# Patient Record
Sex: Female | Born: 1960 | Marital: Married | Smoking: Current every day smoker
Health system: Southern US, Community
[De-identification: ages and names within clinical notes are randomized; demographics above are authoritative.]

## PROBLEM LIST (undated history)

## (undated) DIAGNOSIS — I739 Peripheral vascular disease, unspecified: Secondary | ICD-10-CM

## (undated) DIAGNOSIS — M1990 Unspecified osteoarthritis, unspecified site: Secondary | ICD-10-CM

## (undated) HISTORY — PX: FOOT SURGERY: SHX648

## (undated) HISTORY — DX: Peripheral vascular disease, unspecified: I73.9

## (undated) HISTORY — DX: Unspecified osteoarthritis, unspecified site: M19.90

---

## 2011-09-20 ENCOUNTER — Other Ambulatory Visit: Payer: Self-pay | Admitting: Obstetrics & Gynecology

## 2011-09-20 DIAGNOSIS — Z1231 Encounter for screening mammogram for malignant neoplasm of breast: Secondary | ICD-10-CM

## 2011-10-18 ENCOUNTER — Ambulatory Visit (HOSPITAL_COMMUNITY): Admission: RE | Admit: 2011-10-18 | Payer: BC Managed Care – PPO | Source: Ambulatory Visit

## 2012-02-02 ENCOUNTER — Ambulatory Visit: Payer: Managed Care, Other (non HMO)

## 2012-02-02 ENCOUNTER — Ambulatory Visit (INDEPENDENT_AMBULATORY_CARE_PROVIDER_SITE_OTHER): Payer: Managed Care, Other (non HMO) | Admitting: Family Medicine

## 2012-02-02 DIAGNOSIS — R079 Chest pain, unspecified: Secondary | ICD-10-CM

## 2012-02-02 NOTE — Progress Notes (Signed)
  Subjective:    Patient ID: Holly Barrera, female    DOB: 27-Jan-1961, 51 y.o.   MRN: [id number]  HPI Developed left sided rib pain 5 days ago.  It lasted 2 days.   It started the morning following Zumba dancing as she awoke with the pain.  It has reoccurred over the last 2 days.  It has progressed to involve the upper left chest.  No SOB.  The pain is exacerbated with deep breathing.  She is coughing some.  Today at work the pain got worse. No rash or fever.  Has not really had pain like this before.  No GI or GU sxs.    She has some tight and burning left sided chest discomfort today.  No indigestion.  Review of Systems    no weight loss. No paresthesias. Objective:   Physical Exam GEN: [id number].NAD. HEENT: Normal.  Oropharynx clear.  No neck adenopathy. CHEST: CTAB. Left lower rib teenderness CVS:RRR w/o m/r/g Negative homan's and no leg swelling. NEURO: normal strength.  Sensation is intact. SKIN:  No rash. ABDM:  Mild tenderness in the epigastric region.  No palpable masses.  Chest xray: 2 view reveals chronic markings no infiltrates or acute changes noted  EKG:  NSR with T wave inversion leads V3-V6    Assessment & Plan:  Left lower rib pain  She will increase ibuprofen 200 mg to 3 po tid as needed   She has oxycodone to use    Left upper chest pain/shoulder pain  atypical and associated with deep breathing  Given ECG changes she will be set up for a ETT  ASA 81 mg qd.  patient hand out given.

## 2012-02-02 NOTE — Patient Instructions (Signed)
Chest Pain (Nonspecific) It is often hard to give a specific diagnosis for the cause of chest pain. There is always a chance that your pain could be related to something serious, such as a heart attack or a blood clot in the lungs. You need to follow up with your caregiver for further evaluation. CAUSES   Heartburn.   Pneumonia or bronchitis.   Anxiety or stress.   Inflammation around your heart (pericarditis) or lung (pleuritis or pleurisy).   A blood clot in the lung.   A collapsed lung (pneumothorax). It can develop suddenly on its own (spontaneous pneumothorax) or from injury (trauma) to the chest.   Shingles infection (herpes zoster virus).  The chest wall is composed of bones, muscles, and cartilage. Any of these can be the source of the pain.  The bones can be bruised by injury.   The muscles or cartilage can be strained by coughing or overwork.   The cartilage can be affected by inflammation and become sore (costochondritis).  DIAGNOSIS  Lab tests or other studies, such as X-rays, electrocardiography, stress testing, or cardiac imaging, may be needed to find the cause of your pain.  TREATMENT   Treatment depends on what may be causing your chest pain. Treatment may include:   Acid blockers for heartburn.   Anti-inflammatory medicine.   Pain medicine for inflammatory conditions.   Antibiotics if an infection is present.   You may be advised to change lifestyle habits. This includes stopping smoking and avoiding alcohol, caffeine, and chocolate.   You may be advised to keep your head raised (elevated) when sleeping. This reduces the chance of acid going backward from your stomach into your esophagus.   Most of the time, nonspecific chest pain will improve within 2 to 3 days with rest and mild pain medicine.  HOME CARE INSTRUCTIONS   If antibiotics were prescribed, take your antibiotics as directed. Finish them even if you start to feel better.   For the next few  days, avoid physical activities that bring on chest pain. Continue physical activities as directed.   Do not smoke.   Avoid drinking alcohol.   Only take over-the-counter or prescription medicine for pain, discomfort, or fever as directed by your caregiver.   Follow your caregiver's suggestions for further testing if your chest pain does not go away.   Keep any follow-up appointments you made. If you do not go to an appointment, you could develop lasting (chronic) problems with pain. If there is any problem keeping an appointment, you must call to reschedule.  SEEK MEDICAL CARE IF:   You think you are having problems from the medicine you are taking. Read your medicine instructions carefully.   Your chest pain does not go away, even after treatment.   You develop a rash with blisters on your chest.  SEEK IMMEDIATE MEDICAL CARE IF:   You have increased chest pain or pain that spreads to your arm, neck, jaw, back, or abdomen.   You develop shortness of breath, an increasing cough, or you are coughing up blood.   You have severe back or abdominal pain, feel nauseous, or vomit.   You develop severe weakness, fainting, or chills.   You have a fever.  THIS IS AN EMERGENCY. Do not wait to see if the pain will go away. Get medical help at once. Call your local emergency services (911 in [place] Do not drive yourself to the hospital. MAKE SURE YOU:   Understand these instructions.     Will watch your condition.   Will get help right away if you are not doing well or get worse.  Document Released: 08/10/2005 Document Revised: 10/20/2011 Document Reviewed: 06/05/2008 [employer] Patient Information 2012 [employer]  YOU WILL BE SET UP FOR A CARDIAC STRESS TEST DUE TO THE SUBTLE CHANGES ON YOU ELECTROCARDIOGRAM. TAKE ASPIRIN 81 MG DAILY RECOMMENDED TO STOP SMOKING. USE THE IBUPROFEN AND OXYCODONE AS DISCUSSED.

## 2012-02-10 ENCOUNTER — Encounter: Payer: Self-pay | Admitting: Internal Medicine

## 2012-11-14 HISTORY — PX: ILIAC ARTERY STENT: SHX1786

## 2013-04-01 ENCOUNTER — Ambulatory Visit: Payer: No Typology Code available for payment source

## 2013-04-01 ENCOUNTER — Ambulatory Visit (INDEPENDENT_AMBULATORY_CARE_PROVIDER_SITE_OTHER): Payer: No Typology Code available for payment source | Admitting: Family Medicine

## 2013-04-01 VITALS — BP 124/70 | HR 82 | Temp 98.7°F | Resp 18 | Ht 67.5 in | Wt 158.0 lb

## 2013-04-01 DIAGNOSIS — R200 Anesthesia of skin: Secondary | ICD-10-CM

## 2013-04-01 DIAGNOSIS — M25559 Pain in unspecified hip: Secondary | ICD-10-CM

## 2013-04-01 DIAGNOSIS — M25551 Pain in right hip: Secondary | ICD-10-CM

## 2013-04-01 DIAGNOSIS — M25552 Pain in left hip: Secondary | ICD-10-CM

## 2013-04-01 DIAGNOSIS — R209 Unspecified disturbances of skin sensation: Secondary | ICD-10-CM

## 2013-04-01 DIAGNOSIS — M25569 Pain in unspecified knee: Secondary | ICD-10-CM

## 2013-04-01 LAB — POCT CBC
Granulocyte percent: 59.2 % (ref 37–80)
HCT, POC: 43 % (ref 37.7–47.9)
Hemoglobin: 13.4 g/dL (ref 12.2–16.2)
Lymph, poc: 4 — AB (ref 0.6–3.4)
MCH, POC: 30.1 pg (ref 27–31.2)
MCHC: 31.2 g/dL — AB (ref 31.8–35.4)
MCV: 96.6 fL (ref 80–97)
MID (cbc): 0.8 (ref 0–0.9)
MPV: 9.3 fL (ref 0–99.8)
POC Granulocyte: 7 — AB (ref 2–6.9)
POC LYMPH PERCENT: 33.6 %L (ref 10–50)
POC MID %: 7.2 %M (ref 0–12)
Platelet Count, POC: 386 10*3/uL (ref 142–424)
RBC: 4.45 M/uL (ref 4.04–5.48)
RDW, POC: 14.2 %
WBC: 11.8 10*3/uL — AB (ref 4.6–10.2)

## 2013-04-01 LAB — POCT SEDIMENTATION RATE: POCT SED RATE: 20 mm/h (ref 0–22)

## 2013-04-01 LAB — POCT GLYCOSYLATED HEMOGLOBIN (HGB A1C): Hemoglobin A1C: 5.2

## 2013-04-01 MED ORDER — MELOXICAM 7.5 MG PO TABS: 7.5000 mg | ORAL_TABLET | Freq: Every day | ORAL | Status: DC

## 2013-04-01 NOTE — Progress Notes (Addendum)
[facility]:  Office Visit  Chief Complaint:  Chief Complaint  Patient presents with  . Weakness    bilateral femoral region  . felt knot on Left leg recently-but is no longer there    HPI: Holly Barrera is a 52 y.o. female who complains of bilateral hip pain, worse on right side x 1 month. She feels like it catches and wants to pop but doesn't . She hurts walking from parking lot to office room. She has had sxs x 1 month. She has numbness periodically. Has tried ibuprofen and exercise. Exercise makes it worse. She has no hip injuries. NKI to joints or legs in past.  She was told that she had osteoporosis when she had foot surgery but has never had dexa scan.Numbing aching pain, only when she walks.Also sometimes when she gets up from couch She has had charlie horse, cramps in foot and legs. All  cramps, pressure goes away when she stops walking. Patient is a smoker 1 ppd x 25-30 years. Denies weakness,HTN, diabetes, PVD/PAD, hyperlipidemia, varicose veins    History reviewed. No pertinent past medical history. Past Surgical History  Procedure Laterality Date  . Foot surgery      Dr Brynda Greathouse [facility] foot Center) --left foot bunionectomy (10/2011), right foot callus/bunion resection 11/2011   History   Social History  . Marital Status: Married    Spouse Name: N/A    Number of Children: N/A  . Years of Education: N/A   Social History Main Topics  . Smoking status: Current Every Day Smoker -- 25 years  . Smokeless tobacco: None  . Alcohol Use: No  . Drug Use: No  . Sexually Active: Yes   Other Topics Concern  . None   Social History Narrative  . None   History reviewed. No pertinent family history. Allergies  Allergen Reactions  . Penicillins Hives   Prior to Admission medications   Medication Sig Start Date End Date Taking? Authorizing Provider  B Complex-C (SUPER B COMPLEX PO) Take by mouth.   Yes Historical Provider, MD  Calcium  Carbonate-Vitamin D (CALCIUM 600 + D PO) Take 600 mg by mouth.   Yes Historical Provider, MD  estradiol (ESTRACE) 1 MG tablet Take 1 mg by mouth daily.   Yes Historical Provider, MD  glucosamine-chondroitin 500-400 MG tablet Take 1 tablet by mouth 4 (four) times daily.   Yes Historical Provider, MD  medroxyPROGESTERone (PROVERA) 5 MG tablet Take 5 mg by mouth daily.   Yes Historical Provider, MD     ROS: The patient denies fevers, chills, night sweats, unintentional weight loss, chest pain, palpitations, wheezing, dyspnea on exertion, nausea, vomiting, abdominal pain, dysuria, hematuria, melena,  weakness,  All other systems have been reviewed and were otherwise negative with the exception of those mentioned in the HPI and as above.    PHYSICAL EXAM: Filed Vitals:   04/01/13 1711  BP: 124/70  Pulse: 82  Temp: 98.7 F (37.1 C)  Resp: 18   Filed Vitals:   04/01/13 1711  Height: 5' 7.5" (1.715 m)  Weight: 158 lb (71.668 kg)   Body mass index is 24.37 kg/(m^2).  General: Alert, no acute distress HEENT:  Normocephalic, atraumatic, oropharynx patent.  Cardiovascular:  Regular rate and rhythm, no rubs murmurs or gallops.  No Carotid bruits, radial pulse intact. No pedal edema.  Respiratory: Clear to auscultation bilaterally.  No wheezes, rales, or rhonchi.  No cyanosis, no use of accessory musculature GI: No organomegaly, abdomen  is soft and non-tender, positive bowel sounds.  No masses. Skin: No rashes. Neurologic: Facial musculature symmetric. Psychiatric: Patient is appropriate throughout our interaction. Lymphatic: No cervical lymphadenopathy Musculoskeletal: Gait intact. L-spine No deformities, no scoliosis Full ROM  5/5 strength, sensation intact 2/2 DTRs knees, ankles bilaterally No saddle anesthesia Neg straight leg + DP pulse bialteral foot, good cap refill  Bilateral hips- No deformities, 5/5 strength, sensation itntact + pain in bilateral hips with abduction and  adduction, right > left + small 5 mm LAD on left inguinal area, per patient was bigger but now small and not painful  LABS: Results for orders placed in visit on 04/01/13  POCT CBC      Result Value Range   WBC 11.8 (*) 4.6 - 10.2 K/uL   Lymph, poc 4.0 (*) 0.6 - 3.4   POC LYMPH PERCENT 33.6  10 - 50 %L   MID (cbc) 0.8  0 - 0.9   POC MID % 7.2  0 - 12 %M   POC Granulocyte 7.0 (*) 2 - 6.9   Granulocyte percent 59.2  37 - 80 %G   RBC 4.45  4.04 - 5.48 M/uL   Hemoglobin 13.4  12.2 - 16.2 g/dL   HCT, POC 16.1  09.6 - 47.9 %   MCV 96.6  80 - 97 fL   MCH, POC 30.1  27 - 31.2 pg   MCHC 31.2 (*) 31.8 - 35.4 g/dL   RDW, POC 04.5     Platelet Count, POC 386  142 - 424 K/uL   MPV 9.3  0 - 99.8 fL  POCT GLYCOSYLATED HEMOGLOBIN (HGB A1C)      Result Value Range   Hemoglobin A1C 5.2       EKG/XRAY:   Primary read interpreted by Dr. Conley Rolls at [facility]. No fractures or dislocation of L spine or hips    ASSESSMENT/PLAN: Encounter Diagnoses  Name Primary?  . Hip pain, bilateral Yes  . Numbness in both legs   . Pain in joint, lower leg, unspecified laterality    Rx Mobic trial Gross sideeffects, risk and benefits, and alternatives of medications d/w patient. Patient is aware that all medications have potential sideeffects and we are unable to predict every sideeffect or drug-drug interaction that may occur. Consider PAD  Workup if all lab results are negative F/u prn    Sheyna Pettibone PHUONG, DO 04/01/2013 6:42 PM

## 2013-04-02 LAB — COMPREHENSIVE METABOLIC PANEL
ALT: 10 U/L (ref 0–35)
BUN: 12 mg/dL (ref 6–23)
CO2: 26 mEq/L (ref 19–32)
Creat: 0.93 mg/dL (ref 0.50–1.10)
Total Bilirubin: 0.2 mg/dL — ABNORMAL LOW (ref 0.3–1.2)

## 2013-04-02 LAB — COMPREHENSIVE METABOLIC PANEL WITH GFR
AST: 15 U/L (ref 0–37)
Albumin: 4.5 g/dL (ref 3.5–5.2)
Alkaline Phosphatase: 67 U/L (ref 39–117)
Calcium: 9.8 mg/dL (ref 8.4–10.5)
Chloride: 107 meq/L (ref 96–112)
Glucose, Bld: 96 mg/dL (ref 70–99)
Potassium: 4.3 meq/L (ref 3.5–5.3)
Sodium: 141 meq/L (ref 135–145)
Total Protein: 7 g/dL (ref 6.0–8.3)

## 2013-04-07 ENCOUNTER — Other Ambulatory Visit: Payer: Self-pay | Admitting: Family Medicine

## 2013-04-07 DIAGNOSIS — M25569 Pain in unspecified knee: Secondary | ICD-10-CM

## 2013-04-12 ENCOUNTER — Encounter (INDEPENDENT_AMBULATORY_CARE_PROVIDER_SITE_OTHER): Payer: PRIVATE HEALTH INSURANCE

## 2013-04-12 DIAGNOSIS — M25569 Pain in unspecified knee: Secondary | ICD-10-CM

## 2013-04-12 DIAGNOSIS — I739 Peripheral vascular disease, unspecified: Secondary | ICD-10-CM

## 2013-04-12 DIAGNOSIS — I70219 Atherosclerosis of native arteries of extremities with intermittent claudication, unspecified extremity: Secondary | ICD-10-CM

## 2013-04-15 ENCOUNTER — Other Ambulatory Visit: Payer: Self-pay | Admitting: Family Medicine

## 2013-04-15 ENCOUNTER — Telehealth: Payer: Self-pay | Admitting: Family Medicine

## 2013-04-15 DIAGNOSIS — I739 Peripheral vascular disease, unspecified: Secondary | ICD-10-CM

## 2013-04-15 DIAGNOSIS — Z720 Tobacco use: Secondary | ICD-10-CM

## 2013-04-15 MED ORDER — BUPROPION HCL ER (XL) 150 MG PO TB24: 150.0000 mg | ORAL_TABLET | Freq: Every day | ORAL | Status: DC

## 2013-04-15 NOTE — Telephone Encounter (Signed)
LM to call me about her claudication studies to talk about the options.

## 2013-04-15 NOTE — Progress Notes (Signed)
Discussed with patient ABI studies, medication options,  the radiologist recommended PV consult. She would like to be seen by a specialist. So I will go ahead and just refer her to [facility] [place]. Advise to stop smoking, she would like to quit, discussed different meds and SE profile. She would like to try Zyban, if does not work then will try Chantix. Cost is an issue. We had a long discussion about this..She denies SI/HI

## 2013-04-18 ENCOUNTER — Other Ambulatory Visit: Payer: Self-pay | Admitting: *Deleted

## 2013-04-18 DIAGNOSIS — I70219 Atherosclerosis of native arteries of extremities with intermittent claudication, unspecified extremity: Secondary | ICD-10-CM

## 2013-05-31 ENCOUNTER — Encounter: Payer: PRIVATE HEALTH INSURANCE | Admitting: Vascular Surgery

## 2013-06-13 ENCOUNTER — Encounter: Payer: Self-pay | Admitting: Vascular Surgery

## 2013-06-14 ENCOUNTER — Encounter: Payer: Self-pay | Admitting: Vascular Surgery

## 2013-06-14 ENCOUNTER — Other Ambulatory Visit: Payer: Self-pay

## 2013-06-14 ENCOUNTER — Ambulatory Visit (INDEPENDENT_AMBULATORY_CARE_PROVIDER_SITE_OTHER): Payer: PRIVATE HEALTH INSURANCE | Admitting: Vascular Surgery

## 2013-06-14 ENCOUNTER — Other Ambulatory Visit (INDEPENDENT_AMBULATORY_CARE_PROVIDER_SITE_OTHER): Payer: PRIVATE HEALTH INSURANCE | Admitting: *Deleted

## 2013-06-14 VITALS — BP 135/66 | HR 66 | Resp 16 | Ht 66.0 in | Wt 154.0 lb

## 2013-06-14 DIAGNOSIS — I70219 Atherosclerosis of native arteries of extremities with intermittent claudication, unspecified extremity: Secondary | ICD-10-CM

## 2013-06-14 DIAGNOSIS — I739 Peripheral vascular disease, unspecified: Secondary | ICD-10-CM

## 2013-06-14 NOTE — Progress Notes (Signed)
[facility] [place]  Referred by:  Lenell Antu, DO [address] [place], [place] [address]  Reason for referral: Right leg pain  History of Present Illness  Holly Barrera is a 52 y.o. (1961/09/28) female who presents with chief complaint: right >> left Thigh pain.  Onset of symptom occurred recently.  Pain is described as aching and fatigue, severity 3-6/10, and associated with short distance ambulation.  Patient has attempted to treat this pain with rest.  The patient has no rest pain symptoms also and no leg wounds/ulcers.  Atherosclerotic risk factors include: active smoking.  The patient denies DM and hyperlipidemia.  Past Medical History  Diagnosis Date  . Arthritis   . Peripheral vascular disease     Past Surgical History  Procedure Laterality Date  . Foot surgery      Dr Brynda Greathouse [facility] foot Center) --left foot bunionectomy (10/2011), right foot callus/bunion resection 11/2011    History   Social History  . Marital Status: Married    Spouse Name: N/A    Number of Children: N/A  . Years of Education: N/A   Occupational History  . Not on file.   Social History Main Topics  . Smoking status: Current Every Day Smoker -- 1.00 packs/day for 25 years  . Smokeless tobacco: Not on file  . Alcohol Use: No  . Drug Use: No  . Sexually Active: Yes   Other Topics Concern  . Not on file   Social History Narrative  . No narrative on file    Family History  Problem Relation Age of Onset  . Epilepsy Mother     Current Outpatient Prescriptions on File Prior to Visit  Medication Sig Dispense Refill  . B Complex-C (SUPER B COMPLEX PO) Take by mouth.      . Calcium Carbonate-Vitamin D (CALCIUM 600 + D PO) Take 600 mg by mouth.      . estradiol (ESTRACE) 1 MG tablet Take 1 mg by mouth daily.      Marland Kitchen glucosamine-chondroitin 500-400 MG tablet Take 1 tablet by mouth 4 (four) times daily.      . medroxyPROGESTERone (PROVERA) 5 MG tablet Take 5 mg by  mouth daily.      . meloxicam (MOBIC) 7.5 MG tablet Take 1 tablet (7.5 mg total) by mouth daily. Take with food. No other NSAIDs  30 tablet  0  . buPROPion (WELLBUTRIN XL) 150 MG 24 hr tablet Take 1 tablet (150 mg total) by mouth daily.  30 tablet  5   No current facility-administered medications on file prior to visit.    Allergies  Allergen Reactions  . Penicillins Hives    REVIEW OF SYSTEMS:  (Positives checked otherwise negative)  CARDIOVASCULAR:  [ ]  chest pain, [ ]  chest pressure, [ ]  palpitations, [ ]  shortness of breath when laying flat, [ ]  shortness of breath with exertion,   [ ]  pain in feet when walking, [ ]  pain in feet when laying flat, [ ]  history of blood clot in veins (DVT), [ ]  history of phlebitis, [ ]  swelling in legs, [ ]  varicose veins  PULMONARY:  [ ]  productive cough, [ ]  asthma, [ ]  wheezing  NEUROLOGIC:  [ ]  weakness in arms or legs, [ ]  numbness in arms or legs, [ ]  difficulty speaking or slurred speech, [ ]  temporary loss of vision in one eye, [ ]  dizziness  HEMATOLOGIC:  [ ]  bleeding problems, [ ]  problems with blood clotting too  easily  MUSCULOSKEL:  [x]  joint pain, [ ]  joint swelling  GASTROINTEST:  [ ]   Vomiting blood, [ ]   Blood in stool     GENITOURINARY:  [ ]   Burning with urination, [ ]   Blood in urine  PSYCHIATRIC:  [ ]  history of major depression  INTEGUMENTARY:  [ ]  rashes, [ ]  ulcers  CONSTITUTIONAL:  [ ]  fever, [ ]  chills  For VQI Use Only  PRE-ADM LIVING: Home  AMB STATUS: Ambulatory  CAD Sx: None  PRIOR CHF: None  STRESS TEST: [x]  No, [ ]  Normal, [ ]  + ischemia, [ ]  + MI, [ ]  Both  Physical Examination Filed Vitals:   06/14/13 0902  BP: 135/66  Pulse: 66  Resp: 16  Height: 5\' 6"  (1.676 m)  Weight: 154 lb (69.854 kg)  SpO2: 99%   Body mass index is 24.87 kg/(m^2).  General: A&O x 3, WDWN  Head: Verdi/AT  Ear/Nose/Throat: Hearing grossly intact, nares w/o erythema or drainage, oropharynx w/o Erythema/Exudate  Eyes:  PERRLA, EOMI  Neck: Supple, no nuchal rigidity, no palpable LAD  Pulmonary: Sym exp, good air movt, CTAB, no rales, rhonchi, & wheezing  Cardiac: RRR, Nl S1, S2, no Murmurs, rubs or gallops  Vascular: Vessel Right Left  Radial Palpable Palpable  Brachial Palpable Palpable  Carotid Palpable, without bruit Palpable, without bruit  Aorta Not palpable N/A  Femoral Faintly Palpable Faintly Palpable  Popliteal Not palpable Not palpable  PT Not Palpable Not Palpable  DP Not Palpable Faintly Palpable   Gastrointestinal: soft, NTND, -G/R, - HSM, - masses, - CVAT B  Musculoskeletal: M/S 5/5 throughout , Extremities without ischemic changes   Neurologic: CN 2-12 intact , Pain and light touch intact in extremities , Motor exam as listed above  Psychiatric: Judgment intact, Mood & affect appropriatefor pt's clinical situation  Dermatologic: See M/S exam for extremity exam, no rashes otherwise noted  Lymph : No Cervical, Axillary, or Inguinal lymphadenopathy   Non-Invasive Vascular Imaging  Outside ABI (Date: 04/12/13)  R: 0.62   L: 0.81  Aortoiliac duplex (06/14/2013)  R: CIA [phone] c/s (bi), EIA: 67-244 c/s (mono)  L: CIA [phone] c/s (bi), EIA: 166-324 c/s (mono)  Outside Studies/Documentation 4 pages of outside documents were reviewed including: outside urgent care clinic chart and outside ABI.  Medical Decision Making  Holly Barrera is a 52 y.o. 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 Eventually 20% may require intervention in a year.  I discussed in depth with the patient the nature of atherosclerosis, and emphasized the importance of maximal medical management including strict control of blood pressure, blood glucose, and lipid  levels, antiplatelet agent, obtaining regular exercise, and cessation of smoking.    The patient is aware that without maximal medical management the underlying atherosclerotic disease process will progress, limiting the benefit of any interventions.  I discussed in depth with the patient a walking plan and how to execute such. The patient is currently not on a statin.  I will check lipid profile on pre-procedure lab to see if Lipitor needs to be start.  The patient is currently not on an anti-platelet.  She will start ASA 81 mg PO daily.  The pt is schedule for Ao, BRo, possible iliac intervention on 21 AUG  14.    I discussed with the patient the nature of angiographic procedures, especially the limited patencies of any endovascular intervention.  The patient is aware of that the risks of an angiographic procedure include but are not limited to: bleeding, infection, access site complications, renal failure, embolization, rupture of vessel, dissection, possible need for emergent surgical intervention, possible need for surgical procedures to treat the patient's pathology, anaphylactic reaction to contrast, and stroke and death.  The patient is aware of the risks and agrees to proceed.  Thank you for allowing [place] to participate in this patient's care.  Leonides Sake, MD [facility] [place] Office: [phone] Pager: [phone]  06/14/2013, 9:38 AM

## 2013-06-24 ENCOUNTER — Encounter (HOSPITAL_COMMUNITY): Payer: Self-pay | Admitting: Pharmacy Technician

## 2013-07-04 ENCOUNTER — Ambulatory Visit (HOSPITAL_COMMUNITY)
Admission: RE | Admit: 2013-07-04 | Discharge: 2013-07-04 | Disposition: A | Discharge status: Home / Self Care | Payer: PRIVATE HEALTH INSURANCE | Source: Ambulatory Visit | Attending: Vascular Surgery | Admitting: Vascular Surgery

## 2013-07-04 ENCOUNTER — Encounter (HOSPITAL_COMMUNITY)
Admission: RE | Disposition: A | Discharge status: Home / Self Care | Payer: Self-pay | Source: Ambulatory Visit | Attending: Vascular Surgery

## 2013-07-04 DIAGNOSIS — Z791 Long term (current) use of non-steroidal anti-inflammatories (NSAID): Secondary | ICD-10-CM | POA: Insufficient documentation

## 2013-07-04 DIAGNOSIS — Z880 Allergy status to penicillin: Secondary | ICD-10-CM | POA: Insufficient documentation

## 2013-07-04 DIAGNOSIS — F17200 Nicotine dependence, unspecified, uncomplicated: Secondary | ICD-10-CM | POA: Insufficient documentation

## 2013-07-04 DIAGNOSIS — I70219 Atherosclerosis of native arteries of extremities with intermittent claudication, unspecified extremity: Secondary | ICD-10-CM

## 2013-07-04 DIAGNOSIS — Z7982 Long term (current) use of aspirin: Secondary | ICD-10-CM | POA: Insufficient documentation

## 2013-07-04 DIAGNOSIS — I708 Atherosclerosis of other arteries: Secondary | ICD-10-CM | POA: Insufficient documentation

## 2013-07-04 DIAGNOSIS — Z79899 Other long term (current) drug therapy: Secondary | ICD-10-CM | POA: Insufficient documentation

## 2013-07-04 DIAGNOSIS — M129 Arthropathy, unspecified: Secondary | ICD-10-CM | POA: Insufficient documentation

## 2013-07-04 HISTORY — PX: ABDOMINAL AORTAGRAM: SHX5454

## 2013-07-04 LAB — POCT I-STAT, CHEM 8
Chloride: 111 mEq/L (ref 96–112)
Creatinine, Ser: 0.9 mg/dL (ref 0.50–1.10)
Hemoglobin: 15 g/dL (ref 12.0–15.0)
Potassium: 3.9 mEq/L (ref 3.5–5.1)
Sodium: 145 mEq/L (ref 135–145)

## 2013-07-04 LAB — POCT ACTIVATED CLOTTING TIME
Activated Clotting Time: 273 seconds
Activated Clotting Time: 217 seconds
Activated Clotting Time: 181 seconds

## 2013-07-04 SURGERY — ABDOMINAL AORTAGRAM
Anesthesia: LOCAL

## 2013-07-04 MED ORDER — OXYCODONE-ACETAMINOPHEN 5-325 MG PO TABS: 1.0000 | ORAL_TABLET | ORAL | Status: DC | PRN

## 2013-07-04 MED ORDER — HEPARIN (PORCINE) IN NACL 2-0.9 UNIT/ML-% IJ SOLN
INTRAMUSCULAR | Status: AC
  Filled 2013-07-04: qty 500

## 2013-07-04 MED ORDER — SODIUM CHLORIDE 0.9 % IV SOLN
1.0000 mL/kg/h | INTRAVENOUS | Status: DC
  Administered 2013-07-04: 1 mL/kg/h via INTRAVENOUS

## 2013-07-04 MED ORDER — CLOPIDOGREL BISULFATE 300 MG PO TABS
ORAL_TABLET | ORAL | Status: AC
  Filled 2013-07-04: qty 1

## 2013-07-04 MED ORDER — CLOPIDOGREL BISULFATE 75 MG PO TABS: 75.0000 mg | ORAL_TABLET | Freq: Every day | ORAL | Status: DC

## 2013-07-04 MED ORDER — FENTANYL CITRATE 0.05 MG/ML IJ SOLN
INTRAMUSCULAR | Status: AC
  Filled 2013-07-04: qty 2

## 2013-07-04 MED ORDER — CLOPIDOGREL BISULFATE 75 MG PO TABS
300.0000 mg | ORAL_TABLET | Freq: Once | ORAL | Status: AC
  Administered 2013-07-04: 300 mg via ORAL

## 2013-07-04 MED ORDER — ONDANSETRON HCL 4 MG/2ML IJ SOLN: 4.0000 mg | Freq: Four times a day (QID) | INTRAMUSCULAR | Status: DC | PRN

## 2013-07-04 MED ORDER — MORPHINE SULFATE 2 MG/ML IJ SOLN: 2.0000 mg | INTRAMUSCULAR | Status: DC | PRN

## 2013-07-04 MED ORDER — ACETAMINOPHEN 325 MG PO TABS: 650.0000 mg | ORAL_TABLET | ORAL | Status: DC | PRN

## 2013-07-04 MED ORDER — MIDAZOLAM HCL 2 MG/2ML IJ SOLN
INTRAMUSCULAR | Status: AC
  Filled 2013-07-04: qty 2

## 2013-07-04 MED ORDER — SODIUM CHLORIDE 0.9 % IV SOLN
INTRAVENOUS | Status: DC
  Administered 2013-07-04: 06:00:00 via INTRAVENOUS

## 2013-07-04 MED ORDER — LIDOCAINE HCL (PF) 1 % IJ SOLN
INTRAMUSCULAR | Status: AC
  Filled 2013-07-04: qty 30

## 2013-07-04 MED ORDER — HEPARIN SODIUM (PORCINE) 1000 UNIT/ML IJ SOLN
INTRAMUSCULAR | Status: AC
  Filled 2013-07-04: qty 1

## 2013-07-04 MED ORDER — HEPARIN (PORCINE) IN NACL 2-0.9 UNIT/ML-% IJ SOLN
INTRAMUSCULAR | Status: AC
  Filled 2013-07-04: qty 1000

## 2013-07-04 NOTE — Interval H&P Note (Signed)
[facility] [place]  History and Physical Update  The patient was interviewed and re-examined.  The patient's previous History and Physical has been reviewed and is unchanged. There is no change in the plan of care: Aortogram, bilateral leg runoff, and possible intervention.  Leonides Sake, MD [facility] [place] Office: [phone] Pager: [phone]  07/04/2013, 7:16 AM

## 2013-07-04 NOTE — Op Note (Signed)
OPERATIVE NOTE   PROCEDURE: 1.  Left common femoral artery cannulation under ultrasound guidance 2.  Right common femoral artery cannulation under ultrasound guidance 3.  Placement of catheter in aorta x 2 4.  Aortogram 5.  Right common iliac artery stenting (iCAST 6 mm x 22 mm): kissing technique 6.  Left common iliac artery stenting (iCAST 7 mm x 38 mm): kissing technique 7.  Bilateral leg runoff  PRE-OPERATIVE DIAGNOSIS: short distance claudication, severe bilateral iliac stenoses  POST-OPERATIVE DIAGNOSIS: same as above   SURGEON: Leonides Sake, MD  ANESTHESIA: conscious sedation  ESTIMATED BLOOD LOSS: 50 cc  CONTRAST: 185 cc  FINDING(S):  Aorta: patent, distal heavily calcified and diseased, hypertrophied lumbar arteries evident  Superior mesenteric artery: not visualized Celiac artery: not visualized   Right Left  RA Patent Patent  CIA Near occluded, reconstructed lumen with stent (6 mm) with medial displacement by severe calcific aortic plaque extending into common iliac artery 75-90% stenosis over 2-3 cm length, resolved after stenting  EIA Patent Patent  IIA Patent Patent  CFA Patent Patent  SFA Patent Patent  PFA Patent Patent  Pop Patent Patent  Trif Patent Patent  AT Patent proximally, distal runoff washed out Patent proximally, distal runoff washed out  Pero Patent proximally, distal runoff washed out Patent proximally, distal runoff washed out  PT Patent proximally, distal runoff washed out Patent proximally, distal runoff washed out   SPECIMEN(S):  none  INDICATIONS:   Holly Barrera is a 52 y.o. female who presents with short distance claudication with ultrasound evidence of severe iliac stenosis bilaterally.  The patient presents for: aortogram, bilateral runoff, and possible iliac intervention.  I discussed with the patient the nature of angiographic procedures, especially the limited patencies of any endovascular intervention.  The patient is aware  of that the risks of an angiographic procedure include but are not limited to: bleeding, infection, access site complications, renal failure, embolization, rupture of vessel, dissection, possible need for emergent surgical intervention, possible need for surgical procedures to treat the patient's pathology, and stroke and death.  The patient is aware of the risks and agrees to proceed.  DESCRIPTION: After full informed consent was obtained from the patient, the patient was brought back to the angiography suite.  The patient was placed supine upon the angiography table and connected to monitoring equipment.  The patient was then given conscious sedation, the amounts of which are documented in the patient's chart.  The patient was prepped and drape in the standard fashion for an angiographic procedure.  At this point, attention was turned to the left groin.  Under ultrasound guidance, the left common femoral artery was cannulated with a micropuncture needle.  The microwire was advanced into the iliac arterial system.  The needle was exchanged for a microsheath, which was loaded into the common femoral artery over the wire.  The microwire was exchanged for a [facility] wire which was advanced into the aorta.  The microsheath was then exchanged for a 5-Fr sheath which was loaded into the common femoral artery.  The Omniflush catheter was then loaded over the wire up to the level of L1.  The catheter was connected to the power injector circuit.  After de-airring and de-clotting the circuit, a power injector aortogram was completed.  Based on the images, iliac intervention was going to be necessary to avoid an aortobifemoral bypass.    Under ultrasound guidance, the right common femoral artery was cannulated with a micropuncture needle.  The microwire was  advanced into the iliac arterial system.  The needle was exchanged for a microsheath, which was loaded into the common femoral artery over the wire.  The microwire was  exchanged for a [facility] wire which was advanced into the aorta.  The microsheath was then exchanged for a 7-Fr long sheath which was loaded into the common femoral artery.  I also exchanged the left sheath for a long 7-Fr sheath.  The patient was given 6000 units of Heparin intravenously, which was a therapeutic bolus.  Her ACT demonstrated therapeutic anticoagulation.    At this point, I placed the Omniflush catheter just proximal to the aortic bifurcation to get a better image.  I magnified the view and rotated the image intensifer to steep RAO.  The pelvic angiogram demonstrated the findings above.  I replaced the right wire with a Rosen wire and replaced the dilator in the right sheath.  I advanced the sheath proximal to the calcified ledge in the aorta.  I replaced the left wire with a Rosen wire and replaced the dilator in the left sheath.  I advanced the sheath proximal to the calcified ledge in the aorta.  The dilators were removed.  Based on available stents, I selected a 6 mm x 22 mm iCAST for the right common iliac artery and 7 mm x 38 mm iCAST for the left common iliac artery.  I delivered both stent proximal to the aortic ledge and then pulled back the sheaths on both sides.  I pulled each stent back to appropriate position just proximal to the calcified ledge.  Both stents were deployed in a kissing fashion.  The right stent required balloon angioplasty to 10 atm to fully deploy.  There was no resistance with the left stent deployment.  In this fashion, I reconstructed this patient's distal aorta.  I removed all balloons and then placed the Omniflush catheter just proximal to the aortic bifurcation.  A completion pelvic angiogram was completed: no extravasation, patent bilateral common iliac artery stents, and heavily calcified aorta plaque extending abut to the right common iliac artery stent.  A bilateral leg runoff was completed via the Omniflush.  The findings are listed above.  Both sheaths  were aspirated.  No clots were present and both sheaths were reloaded with heparinized saline.  Both sheaths were pulled back into the external iliac artery on both sides.    COMPLICATIONS: none  CONDITION: stable  Leonides Sake, MD [facility] [place] Office: [phone] Pager: [phone]  07/04/2013, 9:07 AM

## 2013-07-04 NOTE — H&P (View-Only) (Signed)
[facility] [place]  Referred by:  Thao P Le, DO [address] [place], [place] [address]  Reason for referral: Right leg pain  History of Present Illness  Holly Barrera is a 51 y.o. (06/04/1961) female who presents with chief complaint: right >> left Thigh pain.  Onset of symptom occurred recently.  Pain is described as aching and fatigue, severity 3-6/10, and associated with short distance ambulation.  Patient has attempted to treat this pain with rest.  The patient has no rest pain symptoms also and no leg wounds/ulcers.  Atherosclerotic risk factors include: active smoking.  The patient denies DM and hyperlipidemia.  Past Medical History  Diagnosis Date  . Arthritis   . Peripheral vascular disease     Past Surgical History  Procedure Laterality Date  . Foot surgery      Dr Petri ([facility]) --left foot bunionectomy (10/2011), right foot callus/bunion resection 11/2011    History   Social History  . Marital Status: Married    Spouse Name: N/A    Number of Children: N/A  . Years of Education: N/A   Occupational History  . Not on file.   Social History Main Topics  . Smoking status: Current Every Day Smoker -- 1.00 packs/day for 25 years  . Smokeless tobacco: Not on file  . Alcohol Use: No  . Drug Use: No  . Sexually Active: Yes   Other Topics Concern  . Not on file   Social History Narrative  . No narrative on file    Family History  Problem Relation Age of Onset  . Epilepsy Mother     Current Outpatient Prescriptions on File Prior to Visit  Medication Sig Dispense Refill  . B Complex-C (SUPER B COMPLEX PO) Take by mouth.      . Calcium Carbonate-Vitamin D (CALCIUM 600 + D PO) Take 600 mg by mouth.      . estradiol (ESTRACE) 1 MG tablet Take 1 mg by mouth daily.      . glucosamine-chondroitin 500-400 MG tablet Take 1 tablet by mouth 4 (four) times daily.      . medroxyPROGESTERone (PROVERA) 5 MG tablet Take 5 mg by  mouth daily.      . meloxicam (MOBIC) 7.5 MG tablet Take 1 tablet (7.5 mg total) by mouth daily. Take with food. No other NSAIDs  30 tablet  0  . buPROPion (WELLBUTRIN XL) 150 MG 24 hr tablet Take 1 tablet (150 mg total) by mouth daily.  30 tablet  5   No current facility-administered medications on file prior to visit.    Allergies  Allergen Reactions  . Penicillins Hives    REVIEW OF SYSTEMS:  (Positives checked otherwise negative)  CARDIOVASCULAR:  [ ] chest pain, [ ] chest pressure, [ ] palpitations, [ ] shortness of breath when laying flat, [ ] shortness of breath with exertion,   [ ] pain in feet when walking, [ ] pain in feet when laying flat, [ ] history of blood clot in veins (DVT), [ ] history of phlebitis, [ ] swelling in legs, [ ] varicose veins  PULMONARY:  [ ] productive cough, [ ] asthma, [ ] wheezing  NEUROLOGIC:  [ ] weakness in arms or legs, [ ] numbness in arms or legs, [ ] difficulty speaking or slurred speech, [ ] temporary loss of vision in one eye, [ ] dizziness  HEMATOLOGIC:  [ ] bleeding problems, [ ] problems with blood clotting too   easily  MUSCULOSKEL:  [x] joint pain, [ ] joint swelling  GASTROINTEST:  [ ]  Vomiting blood, [ ]  Blood in stool     GENITOURINARY:  [ ]  Burning with urination, [ ]  Blood in urine  PSYCHIATRIC:  [ ] history of major depression  INTEGUMENTARY:  [ ] rashes, [ ] ulcers  CONSTITUTIONAL:  [ ] fever, [ ] chills  For VQI Use Only  PRE-ADM LIVING: Home  AMB STATUS: Ambulatory  CAD Sx: None  PRIOR CHF: None  STRESS TEST: [x] No, [ ] Normal, [ ] + ischemia, [ ] + MI, [ ] Both  Physical Examination Filed Vitals:   06/14/13 0902  BP: 135/66  Pulse: 66  Resp: 16  Height: 5' 6" (1.676 m)  Weight: 154 lb (69.854 kg)  SpO2: 99%   Body mass index is 24.87 kg/(m^2).  General: A&O x 3, WDWN  Head: [place]/AT  Ear/Nose/Throat: Hearing grossly intact, nares w/o erythema or drainage, oropharynx w/o Erythema/Exudate  Eyes:  PERRLA, EOMI  Neck: Supple, no nuchal rigidity, no palpable LAD  Pulmonary: Sym exp, good air movt, CTAB, no rales, rhonchi, & wheezing  Cardiac: RRR, Nl S1, S2, no Murmurs, rubs or gallops  Vascular: Vessel Right Left  Radial Palpable Palpable  Brachial Palpable Palpable  Carotid Palpable, without bruit Palpable, without bruit  Aorta Not palpable N/A  Femoral Faintly Palpable Faintly Palpable  Popliteal Not palpable Not palpable  PT Not Palpable Not Palpable  DP Not Palpable Faintly Palpable   Gastrointestinal: soft, NTND, -G/R, - HSM, - masses, - CVAT B  Musculoskeletal: M/S 5/5 throughout , Extremities without ischemic changes   Neurologic: CN 2-12 intact , Pain and light touch intact in extremities , Motor exam as listed above  Psychiatric: Judgment intact, Mood & affect appropriatefor pt's clinical situation  Dermatologic: See M/S exam for extremity exam, no rashes otherwise noted  Lymph : No Cervical, Axillary, or Inguinal lymphadenopathy   Non-Invasive Vascular Imaging  Outside ABI (Date: 04/12/13)  R: 0.62   L: 0.81  Aortoiliac duplex (06/14/2013)  R: CIA [phone] c/s (bi), EIA: [id number] c/s (mono)  L: CIA [phone] c/s (bi), EIA: 166-324 c/s (mono)  Outside Studies/Documentation 4 pages of outside documents were reviewed including: outside urgent care clinic chart and outside ABI.  Medical Decision Making  Holly Barrera is a 51 y.o. 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 Eventually 20% may require intervention in a year.  I discussed in depth with the patient the nature of atherosclerosis, and emphasized the importance of maximal medical management including strict control of blood pressure, blood glucose, and lipid  levels, antiplatelet agent, obtaining regular exercise, and cessation of smoking.    The patient is aware that without maximal medical management the underlying atherosclerotic disease process will progress, limiting the benefit of any interventions.  I discussed in depth with the patient a walking plan and how to execute such. The patient is currently not on a statin.  I will check lipid profile on pre-procedure lab to see if Lipitor needs to be start.  The patient is currently not on an anti-platelet.  She will start ASA 81 mg PO daily.  The pt is schedule for Ao, BRo, possible iliac intervention on 21 AUG   14.    I discussed with the patient the nature of angiographic procedures, especially the limited patencies of any endovascular intervention.  The patient is aware of that the risks of an angiographic procedure include but are not limited to: bleeding, infection, access site complications, renal failure, embolization, rupture of vessel, dissection, possible need for emergent surgical intervention, possible need for surgical procedures to treat the patient's pathology, anaphylactic reaction to contrast, and stroke and death.  The patient is aware of the risks and agrees to proceed.  Thank you for allowing us to participate in this patient's care.  Brian Chen, MD [facility] [place] Office: [phone] Pager: [phone]  06/14/2013, 9:38 AM 

## 2013-11-15 ENCOUNTER — Other Ambulatory Visit: Payer: Self-pay | Admitting: *Deleted

## 2013-11-18 ENCOUNTER — Other Ambulatory Visit: Payer: Self-pay | Admitting: *Deleted

## 2013-11-18 MED ORDER — CLOPIDOGREL BISULFATE 75 MG PO TABS: 75.0000 mg | ORAL_TABLET | Freq: Every day | ORAL | Status: DC

## 2013-11-19 ENCOUNTER — Encounter: Payer: Self-pay | Admitting: Obstetrics & Gynecology

## 2014-03-01 ENCOUNTER — Ambulatory Visit: Payer: Managed Care, Other (non HMO) | Admitting: Family Medicine

## 2014-03-01 ENCOUNTER — Ambulatory Visit (HOSPITAL_COMMUNITY)
Admission: RE | Admit: 2014-03-01 | Discharge: 2014-03-01 | Disposition: A | Discharge status: Home / Self Care | Payer: Managed Care, Other (non HMO) | Source: Ambulatory Visit | Attending: Family Medicine | Admitting: Family Medicine

## 2014-03-01 VITALS — BP 100/80 | HR 64 | Temp 98.0°F | Ht 66.0 in | Wt 156.6 lb

## 2014-03-01 DIAGNOSIS — R42 Dizziness and giddiness: Secondary | ICD-10-CM | POA: Insufficient documentation

## 2014-03-01 DIAGNOSIS — H8110 Benign paroxysmal vertigo, unspecified ear: Secondary | ICD-10-CM

## 2014-03-01 LAB — POCT CBC
Granulocyte percent: 72.4 %G (ref 37–80)
HCT, POC: 43.2 % (ref 37.7–47.9)
Hemoglobin: 13.9 g/dL (ref 12.2–16.2)
Lymph, poc: 2.3 (ref 0.6–3.4)
MCH, POC: 30.5 pg (ref 27–31.2)
MCHC: 32.2 g/dL (ref 31.8–35.4)
MCV: 95 fL (ref 80–97)
MID (cbc): 0.6 (ref 0–0.9)
MPV: 9.8 fL (ref 0–99.8)
POC Granulocyte: 7.7 — AB (ref 2–6.9)
POC LYMPH PERCENT: 21.9 %L (ref 10–50)
POC MID %: 5.7 %M (ref 0–12)
Platelet Count, POC: 423 10*3/uL (ref 142–424)
RBC: 4.55 M/uL (ref 4.04–5.48)
RDW, POC: 15 %
WBC: 10.7 10*3/uL — AB (ref 4.6–10.2)

## 2014-03-01 LAB — COMPREHENSIVE METABOLIC PANEL
ALT: 11 U/L (ref 0–35)
AST: 17 U/L (ref 0–37)
Albumin: 4.5 g/dL (ref 3.5–5.2)
Alkaline Phosphatase: 69 U/L (ref 39–117)
BUN: 8 mg/dL (ref 6–23)
CO2: 22 mEq/L (ref 19–32)
Calcium: 9.3 mg/dL (ref 8.4–10.5)
Chloride: 109 mEq/L (ref 96–112)
Creat: 0.9 mg/dL (ref 0.50–1.10)
Glucose, Bld: 83 mg/dL (ref 70–99)
Potassium: 4.2 mEq/L (ref 3.5–5.3)
Sodium: 141 mEq/L (ref 135–145)
Total Bilirubin: 0.5 mg/dL (ref 0.2–1.2)
Total Protein: 7.2 g/dL (ref 6.0–8.3)

## 2014-03-01 MED ORDER — MECLIZINE HCL 25 MG PO TABS: 25.0000 mg | ORAL_TABLET | Freq: Three times a day (TID) | ORAL | Status: DC | PRN

## 2014-03-01 NOTE — Progress Notes (Addendum)
Subjective:   This chart was scribed for Elvina SidleKurt Lauenstein, MD by Arlan OrganAshley Leger, [facility] Scribe. This patient was seen in room 9 and the patient's care was started 11:28 AM.    Patient ID: Holly DraftsPatricia Barrera, female    DOB: 13-Jan-1961, 53 y.o.   MRN: [id number]  HPI  HPI Comments: Holly Draftsatricia Bradshaw is a 53 y.o. female with a PMHx of peripheral vascular disease who presents to [facility] complaining of intermittent, moderate dizziness x 1 month that is progressively worsening. She also reports lightheaded-ness, nausea, and mild tinnitus. Pt states these symptoms are worsened when she moves her head in all directions; more severe when her head is turned to the right. She has tried OTC Dramamine and Hydroxyzine with mild temporary improvement. At this time she denies any cough, SOB, fever, loss of hearing, or congestion. Pt is currently an every day smoker. Currently she is taking prescribed Plavix and 2 hormone medications. No other pertinent medical history. No other concerns this visit.  Pt works for a distribution center  Past Medical History  Diagnosis Date   Arthritis    Peripheral vascular disease     Review of Systems  Constitutional: Negative for fever and chills.  HENT: Positive for tinnitus. Negative for congestion.   Respiratory: Negative for cough and shortness of breath.   Gastrointestinal: Positive for nausea. Negative for vomiting.  Skin: Negative for rash.  Neurological: Positive for dizziness and light-headedness.  Psychiatric/Behavioral: Negative for confusion.    Triage Vitals: BP 100/80   Pulse 64   Temp(Src) 98 F (36.7 C) (Oral)   Ht 5\' 6"  (1.676 m)   Wt 156 lb 9.6 oz (71.033 kg)   BMI 25.29 kg/m2   SpO2 100%   Objective:   Physical Exam  Nursing note and vitals reviewed. Constitutional: She is oriented to person, place, and time. She appears well-developed and well-nourished.  HENT:  Head: Normocephalic and  atraumatic.  Eyes: EOM are normal.  Neck: Normal range of motion.  Cardiovascular: Normal rate.   Pulmonary/Chest: Effort normal.  Musculoskeletal: Normal range of motion.  Neurological: She is alert and oriented to person, place, and time.  Skin: Skin is warm and dry.  Psychiatric: She has a normal mood and affect. Her behavior is normal.    Results for orders placed during the hospital encounter of 07/04/13  POCT I-STAT, CHEM 8      Result Value Ref Range   Sodium 145  135 - 145 mEq/L   Potassium 3.9  3.5 - 5.1 mEq/L   Chloride 111  96 - 112 mEq/L   BUN 10  6 - 23 mg/dL   Creatinine, Ser 4.090.90  0.50 - 1.10 mg/dL   Glucose, Bld 84  70 - 99 mg/dL   Calcium, Ion 8.111.22  9.141.12 - 1.23 mmol/L   TCO2 22  0 - 100 mmol/L   Hemoglobin 15.0  12.0 - 15.0 g/dL   HCT 78.244.0  95.636.0 - 21.346.0 %  POCT ACTIVATED CLOTTING TIME      Result Value Ref Range   Activated Clotting Time 191    POCT ACTIVATED CLOTTING TIME      Result Value Ref Range   Activated Clotting Time 181    POCT ACTIVATED CLOTTING TIME      Result Value Ref Range   Activated Clotting Time 273    POCT ACTIVATED CLOTTING TIME      Result Value Ref Range   Activated Clotting  Time 217     Results for orders placed in visit on 03/01/14  POCT CBC      Result Value Ref Range   WBC 10.7 (*) 4.6 - 10.2 K/uL   Lymph, poc 2.3  0.6 - 3.4   POC LYMPH PERCENT 21.9  10 - 50 %L   MID (cbc) 0.6  0 - 0.9   POC MID % 5.7  0 - 12 %M   POC Granulocyte 7.7 (*) 2 - 6.9   Granulocyte percent 72.4  37 - 80 %G   RBC 4.55  4.04 - 5.48 M/uL   Hemoglobin 13.9  12.2 - 16.2 g/dL   HCT, POC 81.143.2  91.437.7 - 47.9 %   MCV 95.0  80 - 97 fL   MCH, POC 30.5  27 - 31.2 pg   MCHC 32.2  31.8 - 35.4 g/dL   RDW, POC 78.215.0     Platelet Count, POC 423  142 - 424 K/uL   MPV 9.8  0 - 99.8 fL       Assessment & Plan:  I personally performed the services described in this documentation, which was scribed in my presence. The recorded information has been reviewed and  is accurate.   Benign paroxysmal positional vertigo - Plan: meclizine (ANTIVERT) 25 MG tablet, POCT CBC, Comprehensive metabolic panel, CT Head Wo Contrast  Signed, Elvina SidleKurt Lauenstein, MD

## 2014-03-13 ENCOUNTER — Telehealth: Payer: Self-pay

## 2014-03-13 NOTE — Telephone Encounter (Signed)
Pt has used all but 3-4 of the antivert left. Pt is still having dizziness. She states the medication helps but does not relieve all of the symptoms. Advised her to RTC, but pt just wants a refill if possible.

## 2014-03-13 NOTE — Telephone Encounter (Signed)
Advise patient to RTC for further evaluation.

## 2014-03-13 NOTE — Telephone Encounter (Signed)
Patient is still dizzy after 15 days of pills.  She has no refills.  Requesting more to [facility] o [facility] [address]    [phone]

## 2014-03-14 NOTE — Telephone Encounter (Signed)
Spoke to patient advised to RTC.  She said she would do so.

## 2014-03-14 NOTE — Telephone Encounter (Signed)
LMVM to CB. 

## 2014-04-10 DIAGNOSIS — M129 Arthropathy, unspecified: Secondary | ICD-10-CM | POA: Insufficient documentation

## 2014-04-10 DIAGNOSIS — F17200 Nicotine dependence, unspecified, uncomplicated: Secondary | ICD-10-CM | POA: Insufficient documentation

## 2014-04-10 DIAGNOSIS — IMO0002 Reserved for concepts with insufficient information to code with codable children: Secondary | ICD-10-CM | POA: Insufficient documentation

## 2014-04-10 DIAGNOSIS — Z7982 Long term (current) use of aspirin: Secondary | ICD-10-CM | POA: Insufficient documentation

## 2014-04-10 DIAGNOSIS — L259 Unspecified contact dermatitis, unspecified cause: Secondary | ICD-10-CM | POA: Insufficient documentation

## 2014-04-10 DIAGNOSIS — Z9861 Coronary angioplasty status: Secondary | ICD-10-CM | POA: Insufficient documentation

## 2014-04-10 DIAGNOSIS — Z880 Allergy status to penicillin: Secondary | ICD-10-CM | POA: Insufficient documentation

## 2014-04-10 DIAGNOSIS — Z8679 Personal history of other diseases of the circulatory system: Secondary | ICD-10-CM | POA: Insufficient documentation

## 2014-04-10 DIAGNOSIS — Z7902 Long term (current) use of antithrombotics/antiplatelets: Secondary | ICD-10-CM | POA: Insufficient documentation

## 2014-04-10 DIAGNOSIS — Z79899 Other long term (current) drug therapy: Secondary | ICD-10-CM | POA: Insufficient documentation

## 2014-04-11 ENCOUNTER — Emergency Department (HOSPITAL_COMMUNITY)
Admission: EM | Admit: 2014-04-11 | Discharge: 2014-04-11 | Disposition: A | Discharge status: Home / Self Care | Payer: Managed Care, Other (non HMO) | Attending: Emergency Medicine | Admitting: Emergency Medicine

## 2014-04-11 ENCOUNTER — Encounter (HOSPITAL_COMMUNITY): Payer: Self-pay | Admitting: Emergency Medicine

## 2014-04-11 DIAGNOSIS — L259 Unspecified contact dermatitis, unspecified cause: Secondary | ICD-10-CM

## 2014-04-11 MED ORDER — FAMOTIDINE 20 MG PO TABS: 20.0000 mg | ORAL_TABLET | Freq: Two times a day (BID) | ORAL | Status: DC

## 2014-04-11 MED ORDER — HYDROCORTISONE 1 % EX CREA: 1.0000 "application " | TOPICAL_CREAM | Freq: Two times a day (BID) | CUTANEOUS | Status: DC

## 2014-04-11 MED ORDER — DIPHENHYDRAMINE HCL 25 MG PO TABS: 25.0000 mg | ORAL_TABLET | Freq: Four times a day (QID) | ORAL | Status: DC | PRN

## 2014-04-11 MED ORDER — PREDNISONE 20 MG PO TABS: 40.0000 mg | ORAL_TABLET | Freq: Every day | ORAL | Status: DC

## 2014-04-11 MED ORDER — DEXAMETHASONE SODIUM PHOSPHATE 10 MG/ML IJ SOLN
10.0000 mg | Freq: Once | INTRAMUSCULAR | Status: AC
  Administered 2014-04-11: 10 mg via INTRAMUSCULAR
  Filled 2014-04-11: qty 1

## 2014-04-11 NOTE — ED Notes (Signed)
Pt states she has a rash  Pt states she had poison ivy and used calamine lotion and it started to get better  Pt states tonight she is broke out everywhere and is itching  Pt states she took some benadryl prior to going to bed without relief

## 2014-04-11 NOTE — Discharge Instructions (Signed)

## 2014-04-16 NOTE — ED Provider Notes (Signed)
CSN: [id number]     Arrival date & time 04/10/14  2326 History   First MD Initiated Contact with Patient 04/11/14 0019     Chief Complaint  Patient presents with  . Rash     (Consider location/radiation/quality/duration/timing/severity/associated sxs/prior Treatment) Patient is a 53 y.o. female presenting with rash. The history is provided by the patient. No language interpreter was used.  Rash Location:  Full body Quality: burning, itchiness and redness   Quality: not draining, not peeling, not scaling and not weeping   Severity:  Moderate Onset quality:  Gradual Duration:  1 week Timing:  Constant Progression:  Spreading Chronicity:  New Context: plant contact (poison ivy 1 week ago)   Context: not exposure to similar rash, not insect bite/sting, not medications, not new detergent/soap and not sick contacts   Relieved by:  Nothing Ineffective treatments:  Antihistamines (and calamine lotion) Associated symptoms: no fever, no hoarse voice, no periorbital edema, no shortness of breath, no sore throat, no throat swelling, no tongue swelling, not vomiting and not wheezing     Past Medical History  Diagnosis Date  . Arthritis   . Peripheral vascular disease    Past Surgical History  Procedure Laterality Date  . Foot surgery      Dr Casilda Carls ([place]) --left foot bunionectomy (10/2011), right foot callus/bunion resection 11/2011  . Coronary stent placement     Family History  Problem Relation Age of Onset  . Epilepsy Mother    History  Substance Use Topics  . Smoking status: Current Every Day Smoker -- 1.00 packs/day for 25 years  . Smokeless tobacco: Not on file  . Alcohol Use: No   OB History   Grav Para Term Preterm Abortions TAB SAB Ect Mult Living                  Review of Systems  Constitutional: Negative for fever.  HENT: Negative for drooling, hoarse voice, sore throat and trouble swallowing.   Respiratory: Negative for cough, shortness of  breath and wheezing.   Gastrointestinal: Negative for vomiting.  Skin: Positive for color change and rash.  Neurological: Negative for syncope.  All other systems reviewed and are negative.    Allergies  Penicillins  Home Medications   Prior to Admission medications   Medication Sig Start Date End Date Taking? Authorizing Provider  aspirin EC 81 MG tablet Take 81 mg by mouth daily.    Historical Provider, MD  B Complex-C (SUPER B COMPLEX PO) Take 1 tablet by mouth daily.     Historical Provider, MD  Calcium Carbonate-Vitamin D (CALCIUM 600 + D PO) Take 600 mg by mouth.    Historical Provider, MD  Cholecalciferol (VITAMIN D3) 3000 UNITS TABS Take by mouth.    Historical Provider, MD  clopidogrel (PLAVIX) 75 MG tablet Take 1 tablet (75 mg total) by mouth daily. 11/18/13   Conrad [place], MD  diphenhydrAMINE (BENADRYL) 25 MG tablet Take 1 tablet (25 mg total) by mouth every 6 (six) hours as needed for itching (Rash). 04/11/14   Antonietta Breach, PA-C  estradiol (ESTRACE) 1 MG tablet Take 1 mg by mouth daily.    Historical Provider, MD  famotidine (PEPCID) 20 MG tablet Take 1 tablet (20 mg total) by mouth 2 (two) times daily. 04/11/14   Antonietta Breach, PA-C  fish oil-omega-3 fatty acids 1000 MG capsule Take 1 g by mouth daily.    Historical Provider, MD  hydrocortisone cream 1 % Apply 1 application  topically 2 (two) times daily. Do not apply to face 04/11/14   Antonietta Breach, PA-C  Magnesium 250 MG TABS Take 1 tablet by mouth daily.    Historical Provider, MD  meclizine (ANTIVERT) 25 MG tablet Take 1 tablet (25 mg total) by mouth 3 (three) times daily as needed for dizziness. 03/01/14   Robyn Haber, MD  medroxyPROGESTERone (PROVERA) 5 MG tablet Take 5 mg by mouth daily.    Historical Provider, MD  POTASSIUM GLUCONATE PO Take 1 tablet by mouth daily.    Historical Provider, MD  predniSONE (DELTASONE) 20 MG tablet Take 2 tablets (40 mg total) by mouth daily. 04/11/14   Antonietta Breach, PA-C   BP 134/91  Pulse  91  Temp(Src) 98 F (36.7 C) (Oral)  Resp 18  Ht 5\' 6"  (1.676 m)  Wt 156 lb (70.761 kg)  BMI 25.19 kg/m2  SpO2 99%  Physical Exam  Nursing note and vitals reviewed. Constitutional: She is oriented to person, place, and time. She appears well-developed and well-nourished. No distress.  HENT:  Head: Normocephalic and atraumatic.  Mouth/Throat: Oropharynx is clear and moist. No oropharyngeal exudate.  No angioedema. Patient tolerating secretions without difficulty. Oropharynx clear.  Eyes: Conjunctivae and EOM are normal. Pupils are equal, round, and reactive to light. No scleral icterus.  Neck: Normal range of motion. Neck supple.  No stridor.  Cardiovascular: Normal rate, regular rhythm and intact distal pulses.   Pulmonary/Chest: Effort normal and breath sounds normal. No stridor. No respiratory distress. She has no wheezes.  No tachypnea or dyspnea  Musculoskeletal: Normal range of motion.  Neurological: She is alert and oriented to person, place, and time.  GCS 15. Patient speaks in full goal oriented sentences.  Skin: Skin is warm and dry. Rash noted. She is not diaphoretic. No erythema. No pallor.  Erythematous, pruritic, macular, blanching rash scattered throughout body; sparing face, but does extend to neck. No skin peeling, drainage, weeping, bullae, or purulent drainage.  Psychiatric: She has a normal mood and affect. Her behavior is normal.    ED Course  Procedures (including critical care time) Labs Review Labs Reviewed - No data to display  Imaging Review No results found.   EKG Interpretation None      MDM   Final diagnoses:  Contact dermatitis    Uncomplicated contact dermatitis. Patient well and nontoxic appearing, hemodynamically stable, and afebrile. No angioedema. Oropharynx clear. Patient tolerating secretions without difficulty. No tachypnea, dyspnea, or hypoxia. Rash not concerning for SJS, erythema multiforme major, or erythema multiforme minor.  Patient denies fever and recent tick bites. Supportive tx recommended with Benadryl, Pepcid, hydrocortisone, and prednisone. Return precautions discussed and patient agreeable to plan with no unaddressed concerns.   Filed Vitals:   04/11/14 0008 04/11/14 0122  BP: 148/86 134/91  Pulse: 84 91  Temp: 98 F (36.7 C)   TempSrc: Oral   Resp: 18 18  Height: 5\' 6"  (1.676 m)   Weight: 156 lb (70.761 kg)   SpO2: 98% 99%       Antonietta Breach, PA-C 04/16/14 [id number]

## 2014-04-23 NOTE — ED Provider Notes (Signed)
Medical screening examination/treatment/procedure(s) were performed by non-physician practitioner and as supervising physician I was immediately available for consultation/collaboration.   EKG Interpretation None        Winna Golla, MD 04/23/14 0219 

## 2014-09-09 IMAGING — CT CT HEAD W/O CM
2 series · 17 of 30 positions shown, 20 images · non-contrast
Comparison: None.

CLINICAL DATA: Multiple episodes of vertigo and dizziness for 1
month

EXAM:
CT HEAD WITHOUT CONTRAST
TECHNIQUE: Contiguous axial images were obtained from the base of the skull
through the vertex without intravenous contrast.

[Series 2: head w/o · axial · non-contrast · 0.39mm/px · z∈[-132,-12]mm · 9 of 30 slices shown, 12 images]
[im 3/30  brain]
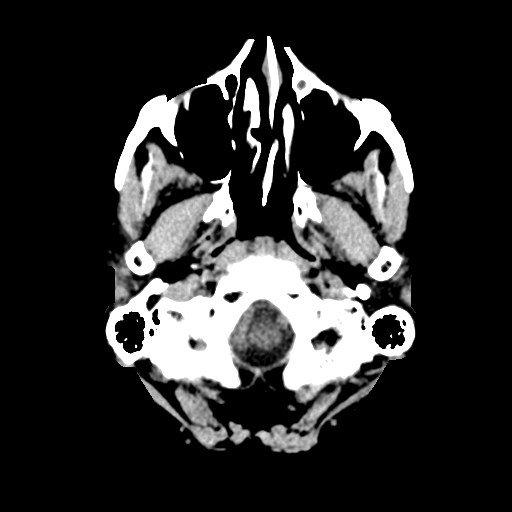
[im 3/30  bone]
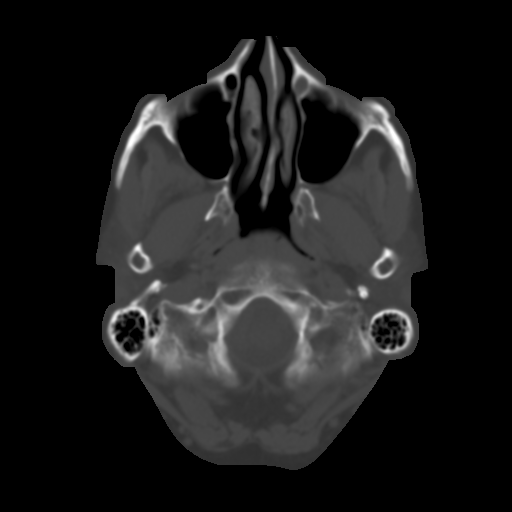
[im 6/30  brain]
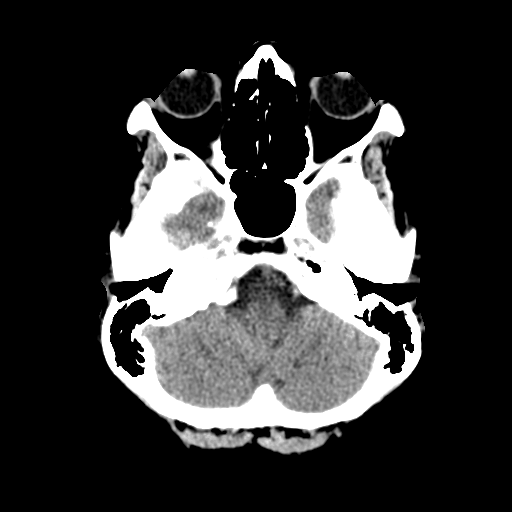
[im 9/30  brain]
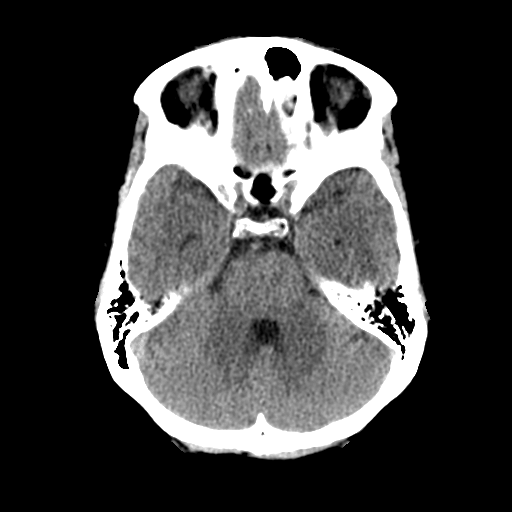
[im 12/30  brain]
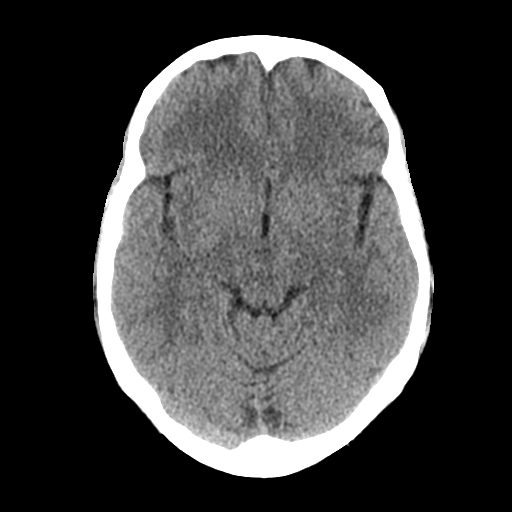
[im 15/30  brain]
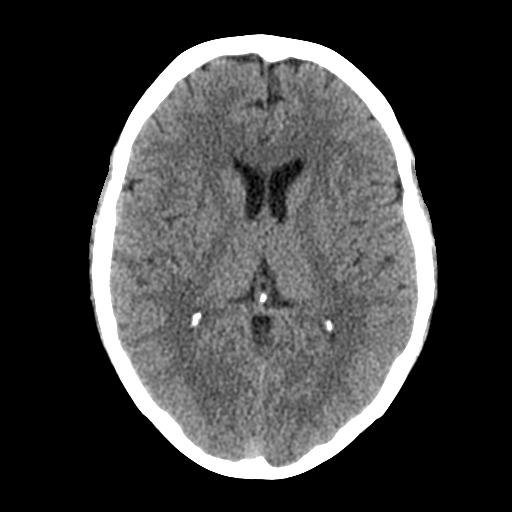
[im 15/30  bone]
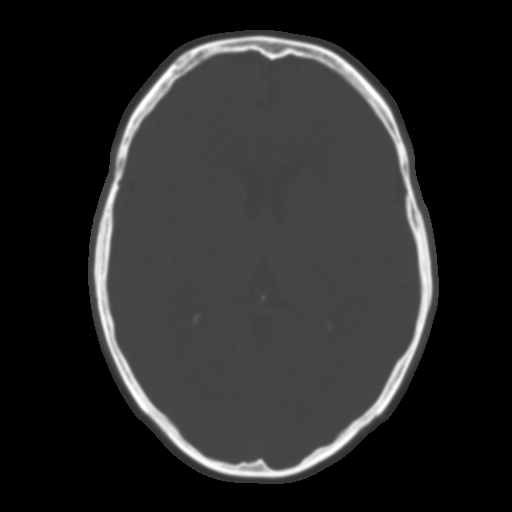
[im 18/30  brain]
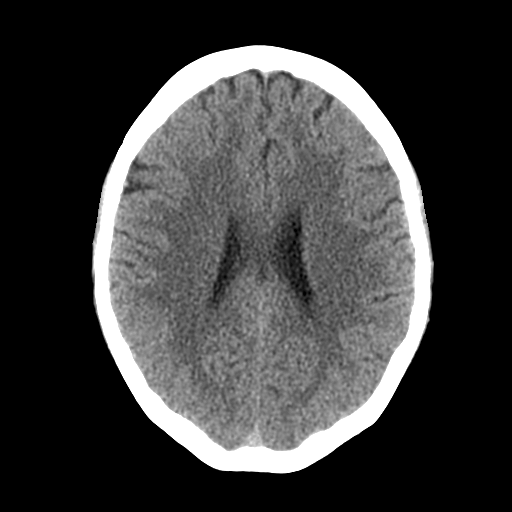
[im 21/30  brain]
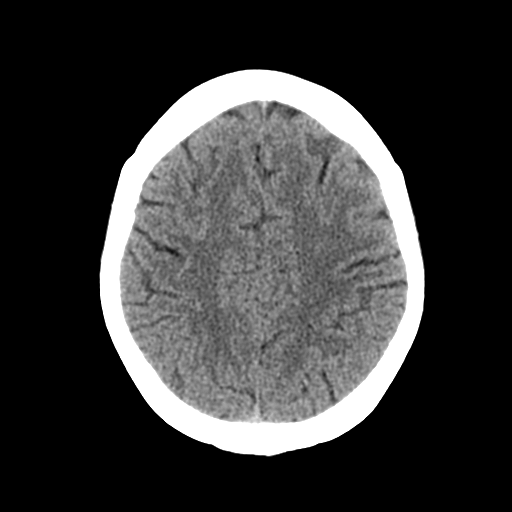
[im 24/30  brain]
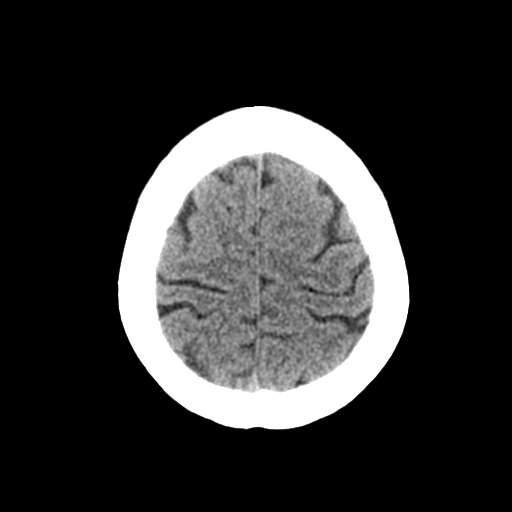
[im 27/30  brain]
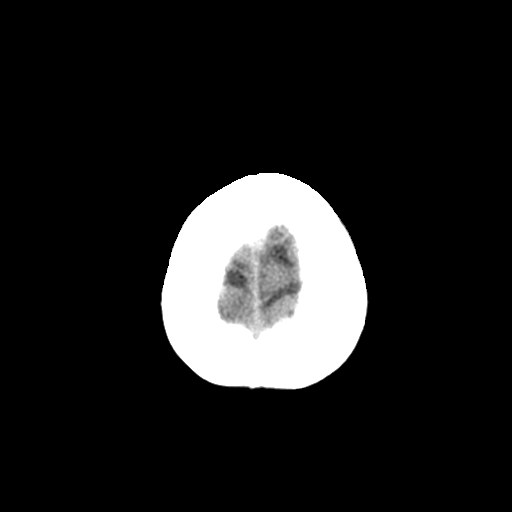
[im 27/30  bone]
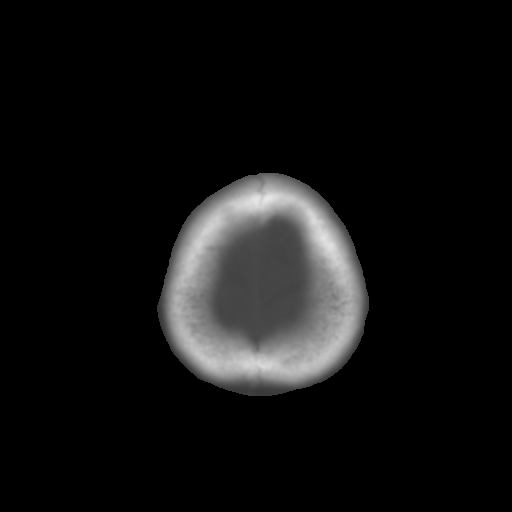

[Series 3: bone windows · axial · 0.39mm/px · z∈[-127,-13]mm · 8 of 50 slices shown]
[im 6/50  bone]
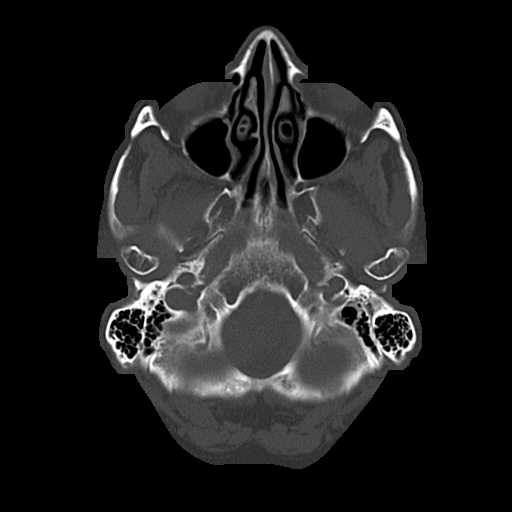
[im 11/50  bone]
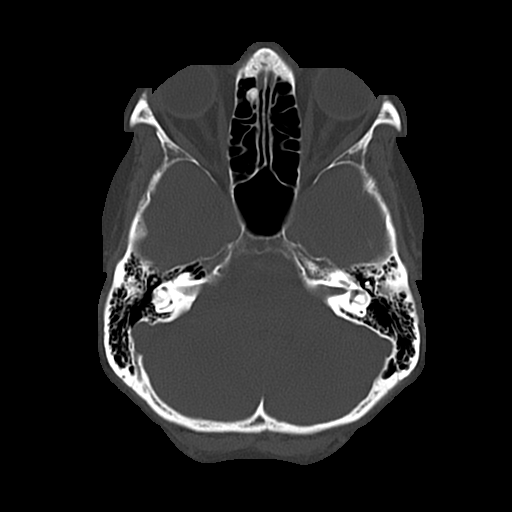
[im 17/50  bone]
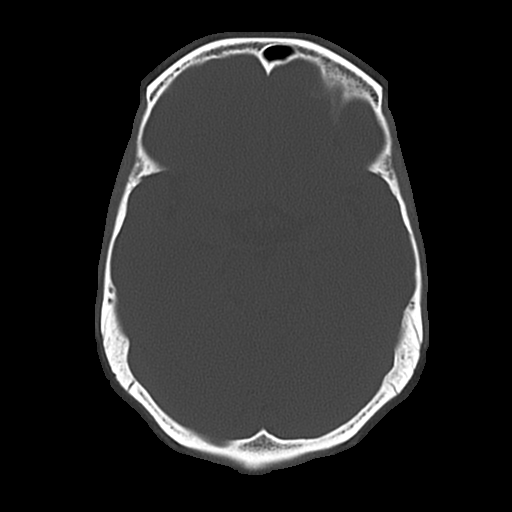
[im 22/50  bone]
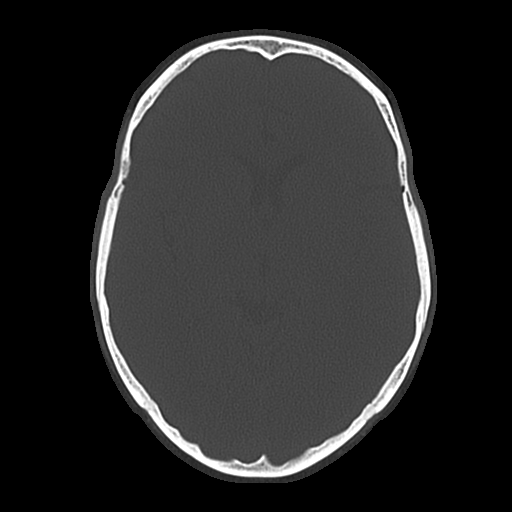
[im 28/50  bone]
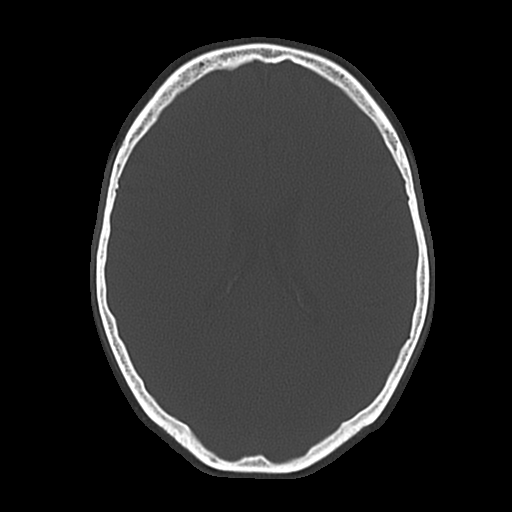
[im 33/50  bone]
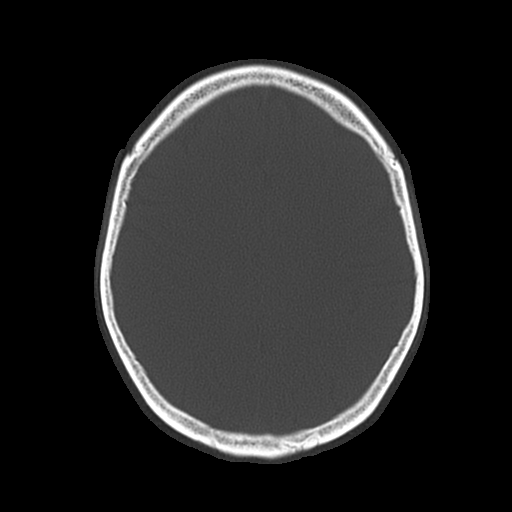
[im 39/50  bone]
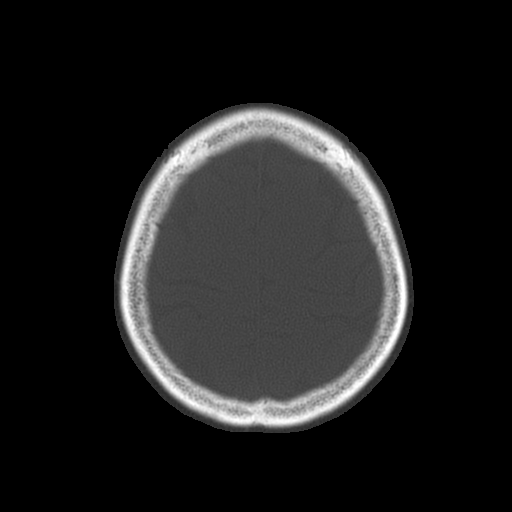
[im 44/50  bone]
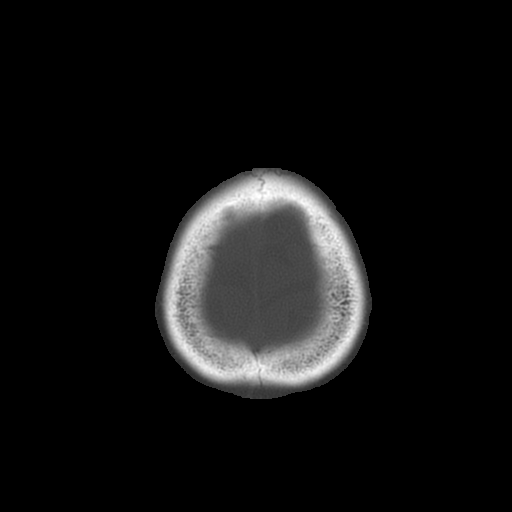

[17 of 30 positions shown; findings below may reference images not displayed]

FINDINGS: Gray-white differentiation is maintained. No CT evidence of acute
large territory infarct. No intraparenchymal or extra-axial mass or
hemorrhage. Normal size and configuration of the ventricles and
basilar cisterns. No midline shift. A small (approximately 0.6 x
cm) osteoma is noted within the anterior aspect of the right
ethmoidal air cells (image 11, series 3). There is under
pneumatization of the right frontal sinuses. The remaining paranasal
sinuses and mastoid air cells are normally aerated. No air-fluid
levels.
IMPRESSION: Negative noncontrast head CT.

## 2014-10-23 ENCOUNTER — Encounter (HOSPITAL_COMMUNITY): Payer: Self-pay | Admitting: Vascular Surgery

## 2014-11-10 ENCOUNTER — Encounter: Payer: Self-pay | Admitting: *Deleted

## 2015-05-27 ENCOUNTER — Other Ambulatory Visit: Payer: Self-pay | Admitting: *Deleted

## 2015-05-27 DIAGNOSIS — I70219 Atherosclerosis of native arteries of extremities with intermittent claudication, unspecified extremity: Secondary | ICD-10-CM

## 2015-06-01 ENCOUNTER — Telehealth: Payer: Self-pay

## 2015-06-01 NOTE — Telephone Encounter (Signed)
-----   Message from Fransisco HertzBrian L Chen, MD sent at 06/01/2015 12:59 PM EDT ----- Regarding: RE: QUESTION ABOUT HOLDING PLAVIX Yes.  Resume after dental work complete.  ----- Message -----    From: [facility] Odorarol S Jahliyah Trice, RN    Sent: 06/01/2015  12:40 PM      To: Fransisco HertzBrian L Chen, MD Subject: QUESTION ABOUT HOLDING PLAVIX                  This pt. had Right and Left CIA stents placed in 06/2013; she is scheduled for a tooth extraction on Sat. 06/06/15, and asking if it is okay to hold her Plavix this week, prior to procedure.  Last dose taken was yesterday.  FYI: she is not taking ASA.

## 2015-06-01 NOTE — Telephone Encounter (Signed)
Pt. notified of approval by Dr. Imogene Burnhen to hold Plavix from today until after her tooth extraction on 06/06/15;  Per Dr. Imogene Burnhen pt. was advised to resume the Plavix after her procedure; advised to talk to the oral surgeon about this at time of her tooth extraction.  Verbalized understanding.

## 2015-06-15 ENCOUNTER — Encounter: Payer: Self-pay | Admitting: Family

## 2015-06-17 ENCOUNTER — Other Ambulatory Visit: Payer: Self-pay | Admitting: Vascular Surgery

## 2015-06-17 ENCOUNTER — Encounter: Payer: Self-pay | Admitting: Family

## 2015-06-17 ENCOUNTER — Ambulatory Visit (HOSPITAL_COMMUNITY)
Admission: RE | Admit: 2015-06-17 | Discharge: 2015-06-17 | Disposition: A | Discharge status: Home / Self Care | Payer: Managed Care, Other (non HMO) | Source: Ambulatory Visit | Attending: Family | Admitting: Family

## 2015-06-17 ENCOUNTER — Ambulatory Visit (INDEPENDENT_AMBULATORY_CARE_PROVIDER_SITE_OTHER)
Admission: RE | Admit: 2015-06-17 | Discharge: 2015-06-17 | Disposition: A | Discharge status: Home / Self Care | Payer: Managed Care, Other (non HMO) | Source: Ambulatory Visit | Attending: Family | Admitting: Family

## 2015-06-17 ENCOUNTER — Other Ambulatory Visit: Payer: Self-pay | Admitting: *Deleted

## 2015-06-17 ENCOUNTER — Ambulatory Visit (INDEPENDENT_AMBULATORY_CARE_PROVIDER_SITE_OTHER): Payer: Managed Care, Other (non HMO) | Admitting: Family

## 2015-06-17 VITALS — BP 130/90 | HR 69 | Temp 97.5°F | Resp 16 | Ht 66.0 in | Wt 158.0 lb

## 2015-06-17 DIAGNOSIS — I739 Peripheral vascular disease, unspecified: Secondary | ICD-10-CM

## 2015-06-17 DIAGNOSIS — Z95828 Presence of other vascular implants and grafts: Secondary | ICD-10-CM

## 2015-06-17 DIAGNOSIS — I70219 Atherosclerosis of native arteries of extremities with intermittent claudication, unspecified extremity: Secondary | ICD-10-CM

## 2015-06-17 DIAGNOSIS — I779 Disorder of arteries and arterioles, unspecified: Secondary | ICD-10-CM

## 2015-06-17 DIAGNOSIS — Z48812 Encounter for surgical aftercare following surgery on the circulatory system: Secondary | ICD-10-CM | POA: Diagnosis not present

## 2015-06-17 DIAGNOSIS — Z720 Tobacco use: Secondary | ICD-10-CM

## 2015-06-17 DIAGNOSIS — Z9889 Other specified postprocedural states: Secondary | ICD-10-CM | POA: Diagnosis not present

## 2015-06-17 DIAGNOSIS — F17200 Nicotine dependence, unspecified, uncomplicated: Secondary | ICD-10-CM

## 2015-06-17 MED ORDER — CLOPIDOGREL BISULFATE 75 MG PO TABS: 75.0000 mg | ORAL_TABLET | Freq: Every day | ORAL | Status: DC

## 2015-06-17 NOTE — Progress Notes (Signed)
[facility] -PAD  History of Present Illness Holly Barrera is a 54 y.o. female patient of Dr. Imogene Burn who is s/p aortogram with bilateral leg runoff, right common iliac artery stenting (iCAST 6 mm x 22 mm): kissing technique, and left common iliac artery stenting (iCAST 7 mm x 38 mm): kissing technique on 07/04/13 by Dr. Imogene Burn. It appears that pt has been lost to follow up until today.  She has no claudication in either leg since the above stents placed. She was having a tired feeling in both thighs with walking, left worse than right. She has no non healing wounds.  Pt denies any history of stroke or TIA. She denies any known cardiac problems, but her surgical history includes coronary artery stent placement, but pt denies that she has had this.  Pt states she has not seen her PCP, Dr. Nedra Barrera,  in a couple of years.  Pt states she does not consume ETOH.  The patient reports New Medical or Surgical History: tooth extraction recently.  Pt Diabetic: No Pt smoker: smoker  (almost 1 ppd, started at age 66 yrs)  Pt meds include: Statin :No Betablocker: No ASA: No Other anticoagulants/antiplatelets: Plavix  Past Medical History  Diagnosis Date  . Arthritis   . Peripheral vascular disease     Social History History  Substance Use Topics  . Smoking status: Current Every Day Smoker -- 1.00 packs/day for 25 years  . Smokeless tobacco: Not on file  . Alcohol Use: No    Family History Family History  Problem Relation Age of Onset  . Epilepsy Mother     Past Surgical History  Procedure Laterality Date  . Foot surgery      Dr Brynda Greathouse [facility]) --left foot bunionectomy (10/2011), right foot callus/bunion resection 11/2011  . Coronary stent placement    . Abdominal aortagram N/A 07/04/2013    Procedure: ABDOMINAL Ronny Flurry;  Surgeon: Fransisco Hertz, MD;  Location: [facility] CATH LAB;  Service: Cardiovascular;  Laterality: N/A;     Allergies  Allergen Reactions  . Penicillins Hives    Current Outpatient Prescriptions  Medication Sig Dispense Refill  . aspirin EC 81 MG tablet Take 81 mg by mouth daily.    . B Complex-C (SUPER B COMPLEX PO) Take 1 tablet by mouth daily.     . Calcium Carbonate-Vitamin D (CALCIUM 600 + D PO) Take 600 mg by mouth.    . Cholecalciferol (VITAMIN D3) 3000 UNITS TABS Take by mouth.    . clopidogrel (PLAVIX) 75 MG tablet Take 1 tablet (75 mg total) by mouth daily. 90 tablet 4  . diphenhydrAMINE (BENADRYL) 25 MG tablet Take 1 tablet (25 mg total) by mouth every 6 (six) hours as needed for itching (Rash). 30 tablet 0  . estradiol (ESTRACE) 1 MG tablet Take 1 mg by mouth daily.    . famotidine (PEPCID) 20 MG tablet Take 1 tablet (20 mg total) by mouth 2 (two) times daily. 30 tablet 0  . fish oil-omega-3 fatty acids 1000 MG capsule Take 1 g by mouth daily.    . hydrocortisone cream 1 % Apply 1 application topically 2 (two) times daily. Do not apply to face 15 g 1  . Magnesium 250 MG TABS Take 1 tablet by mouth daily.    . meclizine (ANTIVERT) 25 MG tablet Take 1 tablet (25 mg total) by mouth 3 (three) times daily as needed for dizziness. 30 tablet 0  . medroxyPROGESTERone (  PROVERA) 5 MG tablet Take 5 mg by mouth daily.    Marland Kitchen POTASSIUM GLUCONATE PO Take 1 tablet by mouth daily.    . predniSONE (DELTASONE) 20 MG tablet Take 2 tablets (40 mg total) by mouth daily. 10 tablet 0   No current facility-administered medications for this visit.    ROS: See HPI for pertinent positives and negatives.   Physical Examination  Filed Vitals:   06/17/15 1357  BP: 130/90  Pulse: 69  Temp: 97.5 F (36.4 C)  Resp: 16  Height: 5\' 6"  (1.676 m)  Weight: 158 lb (71.668 kg)  SpO2: 98%   Body mass index is 25.51 kg/(m^2).  General: A&O x 3, WDWN. Mouth: dentition in advanced state of decay Gait: normal Eyes: PERRLA. Pulmonary: CTAB, without wheezes , rales or rhonchi. Cardiac: regular Rythm ,  without detected murmur.         Carotid Bruits Right Left   Negative Negative  Aorta is faintly palpable. Radial pulses: 2+ palpable and =                           VASCULAR EXAM: Extremities without ischemic changes  without Gangrene; without open wounds.                                                                                                          LE Pulses Right Left       FEMORAL  2+ palpable   2+ palpable        POPLITEAL  not pot palpable   not palpable       POSTERIOR TIBIAL  not palpable   not palpable        DORSALIS PEDIS      ANTERIOR TIBIAL 2+ palpable  2+ palpable    Abdomen: soft, NT, no palpable masses. Skin: no rashes, no ulcers. Musculoskeletal: no muscle wasting or atrophy.  Neurologic: A&O X 3; Appropriate Affect; MOTOR FUNCTION:  moving all extremities equally, motor strength 5/5 throughout. Speech is fluent/normal. CN 2-12 intact.    Non-Invasive Vascular Imaging: DATE: 06/17/2015  LOWER EXTREMITY ARTERIAL DUPLEX EVALUATION    INDICATION: Peripheral Vascular Disease    PREVIOUS INTERVENTION(S): Bilateral iliac kissing stents placed 07/04/2013    DUPLEX EXAM:     RIGHT  LEFT   Peak Systolic Velocity (cm/s) Ratio (if abnormal) Waveform  Peak Systolic Velocity (cm/s) Ratio (if abnormal) Waveform  150  T Common Femoral Artery 269  T  93  T Deep Femoral Artery 93  T  125  T Superficial Femoral Artery Proximal 141  T  98  T Superficial Femoral Artery Mid 93  T  94  T Superficial Femoral Artery Distal 44  T  50/60  T/T Popliteal Artery 47/62  T/T  58  B Posterior Tibial Artery Dist 44  B  66  T Anterior Tibial Artery Distal 59  T  34  B Peroneal Artery Distal 37  B  0.94/1.06 Today's ABI / TBI 0.99/0.66  0.62 Previous ABI / TBI (  04/05/2013 [place]  ) 0.81    Waveform:    M - Monophasic       B - Biphasic       T - Triphasic  If Ankle Brachial Index (ABI) or Toe Brachial Index (TBI) performed, please see complete report      ADDITIONAL FINDINGS:     IMPRESSION: Bilateral lower extremity arterial system is patent with no hemodynamically significant plaque or stenosis present.    Compared to the previous exam:  No previous lower extremity duplex to compare, improvement in ankle brachial indices since outside study on 04/05/2013.     ASSESSMENT: Holly Barrera is a 54 y.o. female who is s/p bilateral iliac kissing stents placed 07/04/2013.  It appears that pt has been lost to follow up until today. She has no claudication in either leg since the above stents placed. She was having a tired feeling in both thighs with walking, left worse than right. She has no non healing wounds.  She has not seen her PCP, Dr. Nedra Barrera,  in a couple of years, see Plan. After discussing with Dr. Edilia Bo, will renew the Plavix, no need to add ASA.  Today's bilateral LE arterial Duplex suggests bilateral lower extremity arterial system is patent with no hemodynamically significant plaque or stenosis present. No previous lower extremity duplex to compare, improvement in ankle brachial indices since outside study on 04/05/2013.  She does not have DM that she is aware, has not had her cholesterol checked since she has not seen her PCP in at least a couple of years. Unfortunately she continues to smoke, see Plan.  Face to face time with patient was 25 minutes. Over 50% of this time was spent on counseling and coordination of care.    PLAN:  Pt advised to make an appointment with her PCP ASAP. The patient was counseled re smoking cessation and given several free resources re smoking cessation.  Based on the patient's vascular studies and examination, pt will return to clinic in 1 year for ABI's and bilateral iliac artery stent Duplex. Plavix renewed.  I discussed in depth with the patient the nature of atherosclerosis, and emphasized the importance of maximal medical management including strict control of blood pressure, blood  glucose, and lipid levels, obtaining regular exercise, and cessation of smoking.  The patient is aware that without maximal medical management the underlying atherosclerotic disease process will progress, limiting the benefit of any interventions.  The patient was given information about PAD including signs, symptoms, treatment, what symptoms should prompt the patient to seek immediate medical care, and risk reduction measures to take.  Charisse March, RN, MSN, FNP-C [facility] Phone: [phone]  Clinic MD: Edilia Bo  06/17/2015 1:57 PM

## 2015-06-17 NOTE — Patient Instructions (Signed)
Peripheral Vascular Disease Peripheral Vascular Disease (PVD), also called Peripheral Arterial Disease (PAD), is a circulation problem caused by cholesterol (atherosclerotic plaque) deposits in the arteries. PVD commonly occurs in the lower extremities (legs) but it can occur in other areas of the body, such as your arms. The cholesterol buildup in the arteries reduces blood flow which can cause pain and other serious problems. The presence of PVD can place a person at risk for Coronary Artery Disease (CAD).  CAUSES  Causes of PVD can be many. It is usually associated with more than one risk factor such as:   High Cholesterol.  Smoking.  Diabetes.  Lack of exercise or inactivity.  High blood pressure (hypertension).  Obesity.  Family history. SYMPTOMS   When the lower extremities are affected, patients with PVD may experience:  Leg pain with exertion or physical activity. This is called INTERMITTENT CLAUDICATION. This may present as cramping or numbness with physical activity. The location of the pain is associated with the level of blockage. For example, blockage at the abdominal level (distal abdominal aorta) may result in buttock or hip pain. Lower leg arterial blockage may result in calf pain.  As PVD becomes more severe, pain can develop with less physical activity.  In people with severe PVD, leg pain may occur at rest.  Other PVD signs and symptoms:  Leg numbness or weakness.  Coldness in the affected leg or foot, especially when compared to the other leg.  A change in leg color.  Patients with significant PVD are more prone to ulcers or sores on toes, feet or legs. These may take longer to heal or may reoccur. The ulcers or sores can become infected.  If signs and symptoms of PVD are ignored, gangrene may occur. This can result in the loss of toes or loss of an entire limb.  Not all leg pain is related to PVD. Other medical conditions can cause leg pain such  as:  Blood clots (embolism) or Deep Vein Thrombosis.  Inflammation of the blood vessels (vasculitis).  Spinal stenosis. DIAGNOSIS  Diagnosis of PVD can involve several different types of tests. These can include:  Pulse Volume Recording Method (PVR). This test is simple, painless and does not involve the use of X-rays. PVR involves measuring and comparing the blood pressure in the arms and legs. An ABI (Ankle-Brachial Index) is calculated. The normal ratio of blood pressures is 1. As this number becomes smaller, it indicates more severe disease.  < 0.95 - indicates significant narrowing in one or more leg vessels.  <0.8 - there will usually be pain in the foot, leg or buttock with exercise.  <0.4 - will usually have pain in the legs at rest.  <0.25 - usually indicates limb threatening PVD.  Doppler detection of pulses in the legs. This test is painless and checks to see if you have a pulses in your legs/feet.  A dye or contrast material (a substance that highlights the blood vessels so they show up on x-ray) may be given to help your caregiver better see the arteries for the following tests. The dye is eliminated from your body by the kidney's. Your caregiver may order blood work to check your kidney function and other laboratory values before the following tests are performed:  Magnetic Resonance Angiography (MRA). An MRA is a picture study of the blood vessels and arteries. The MRA machine uses a large magnet to produce images of the blood vessels.  Computed Tomography Angiography (CTA). A CTA   is a specialized x-ray that looks at how the blood flows in your blood vessels. An IV may be inserted into your arm so contrast dye can be injected.  Angiogram. Is a procedure that uses x-rays to look at your blood vessels. This procedure is minimally invasive, meaning a small incision (cut) is made in your groin. A small tube (catheter) is then inserted into the artery of your groin. The catheter  is guided to the blood vessel or artery your caregiver wants to examine. Contrast dye is injected into the catheter. X-rays are then taken of the blood vessel or artery. After the images are obtained, the catheter is taken out. TREATMENT  Treatment of PVD involves many interventions which may include:  Lifestyle changes:  Quitting smoking.  Exercise.  Following a low fat, low cholesterol diet.  Control of diabetes.  Foot care is very important to the PVD patient. Good foot care can help prevent infection.  Medication:  Cholesterol-lowering medicine.  Blood pressure medicine.  Anti-platelet drugs.  Certain medicines may reduce symptoms of Intermittent Claudication.  Interventional/Surgical options:  Angioplasty. An Angioplasty is a procedure that inflates a balloon in the blocked artery. This opens the blocked artery to improve blood flow.  Stent Implant. A wire mesh tube (stent) is placed in the artery. The stent expands and stays in place, allowing the artery to remain open.  Peripheral Bypass Surgery. This is a surgical procedure that reroutes the blood around a blocked artery to help improve blood flow. This type of procedure may be performed if Angioplasty or stent implants are not an option. SEEK IMMEDIATE MEDICAL CARE IF:   You develop pain or numbness in your arms or legs.  Your arm or leg turns cold, becomes blue in color.  You develop redness, warmth, swelling and pain in your arms or legs. MAKE SURE YOU:   Understand these instructions.  Will watch your condition.  Will get help right away if you are not doing well or get worse. Document Released: 12/08/2004 Document Revised: 01/23/2012 Document Reviewed: 11/04/2008 ExitCare Patient Information 2015 [employer]. This information is not intended to replace advice given to you by your health care provider. Make sure you discuss any questions you have with your health care provider.    Smoking  Cessation Quitting smoking is important to your health and has many advantages. However, it is not always easy to quit since nicotine is a very addictive drug. Oftentimes, people try 3 times or more before being able to quit. This document explains the best ways for you to prepare to quit smoking. Quitting takes hard work and a lot of effort, but you can do it. ADVANTAGES OF QUITTING SMOKING  You will live longer, feel better, and live better.  Your body will feel the impact of quitting smoking almost immediately.  Within 20 minutes, blood pressure decreases. Your pulse returns to its normal level.  After 8 hours, carbon monoxide levels in the blood return to normal. Your oxygen level increases.  After 24 hours, the chance of having a heart attack starts to decrease. Your breath, hair, and body stop smelling like smoke.  After 48 hours, damaged nerve endings begin to recover. Your sense of taste and smell improve.  After 72 hours, the body is virtually free of nicotine. Your bronchial tubes relax and breathing becomes easier.  After 2 to 12 weeks, lungs can hold more air. Exercise becomes easier and circulation improves.  The risk of having a heart attack, stroke,   cancer, or lung disease is greatly reduced.  After 1 year, the risk of coronary heart disease is cut in half.  After 5 years, the risk of stroke falls to the same as a nonsmoker.  After 10 years, the risk of lung cancer is cut in half and the risk of other cancers decreases significantly.  After 15 years, the risk of coronary heart disease drops, usually to the level of a nonsmoker.  If you are pregnant, quitting smoking will improve your chances of having a healthy baby.  The people you live with, especially any children, will be healthier.  You will have extra money to spend on things other than cigarettes. QUESTIONS TO THINK ABOUT BEFORE ATTEMPTING TO QUIT You may want to talk about your answers with your health care  provider.  Why do you want to quit?  If you tried to quit in the past, what helped and what did not?  What will be the most difficult situations for you after you quit? How will you plan to handle them?  Who can help you through the tough times? Your family? Friends? A health care provider?  What pleasures do you get from smoking? What ways can you still get pleasure if you quit? Here are some questions to ask your health care provider:  How can you help me to be successful at quitting?  What medicine do you think would be best for me and how should I take it?  What should I do if I need more help?  What is smoking withdrawal like? How can I get information on withdrawal? GET READY  Set a quit date.  Change your environment by getting rid of all cigarettes, ashtrays, matches, and lighters in your home, car, or work. Do not let people smoke in your home.  Review your past attempts to quit. Think about what worked and what did not. GET SUPPORT AND ENCOURAGEMENT You have a better chance of being successful if you have help. You can get support in many ways.  Tell your family, friends, and coworkers that you are going to quit and need their support. Ask them not to smoke around you.  Get individual, group, or telephone counseling and support. Programs are available at local hospitals and health centers. Call your local health department for information about programs in your area.  Spiritual beliefs and practices may help some smokers quit.  Download a "quit meter" on your computer to keep track of quit statistics, such as how long you have gone without smoking, cigarettes not smoked, and money saved.  Get a self-help book about quitting smoking and staying off tobacco. LEARN NEW SKILLS AND BEHAVIORS  Distract yourself from urges to smoke. Talk to someone, go for a walk, or occupy your time with a task.  Change your normal routine. Take a different route to work. Drink tea  instead of coffee. Eat breakfast in a different place.  Reduce your stress. Take a hot bath, exercise, or read a book.  Plan something enjoyable to do every day. Reward yourself for not smoking.  Explore interactive web-based programs that specialize in helping you quit. GET MEDICINE AND USE IT CORRECTLY Medicines can help you stop smoking and decrease the urge to smoke. Combining medicine with the above behavioral methods and support can greatly increase your chances of successfully quitting smoking.  Nicotine replacement therapy helps deliver nicotine to your body without the negative effects and risks of smoking. Nicotine replacement therapy includes nicotine gum, lozenges,   inhalers, nasal sprays, and skin patches. Some may be available over-the-counter and others require a prescription.  Antidepressant medicine helps people abstain from smoking, but how this works is unknown. This medicine is available by prescription.  Nicotinic receptor partial agonist medicine simulates the effect of nicotine in your brain. This medicine is available by prescription. Ask your health care provider for advice about which medicines to use and how to use them based on your health history. Your health care provider will tell you what side effects to look out for if you choose to be on a medicine or therapy. Carefully read the information on the package. Do not use any other product containing nicotine while using a nicotine replacement product.  RELAPSE OR DIFFICULT SITUATIONS Most relapses occur within the first 3 months after quitting. Do not be discouraged if you start smoking again. Remember, most people try several times before finally quitting. You may have symptoms of withdrawal because your body is used to nicotine. You may crave cigarettes, be irritable, feel very hungry, cough often, get headaches, or have difficulty concentrating. The withdrawal symptoms are only temporary. They are strongest when you  first quit, but they will go away within 10-14 days. To reduce the chances of relapse, try to:  Avoid drinking alcohol. Drinking lowers your chances of successfully quitting.  Reduce the amount of caffeine you consume. Once you quit smoking, the amount of caffeine in your body increases and can give you symptoms, such as a rapid heartbeat, sweating, and anxiety.  Avoid smokers because they can make you want to smoke.  Do not let weight gain distract you. Many smokers will gain weight when they quit, usually less than 10 pounds. Eat a healthy diet and stay active. You can always lose the weight gained after you quit.  Find ways to improve your mood other than smoking. FOR MORE INFORMATION  www.smokefree.gov  Document Released: 10/25/2001 Document Revised: 03/17/2014 Document Reviewed: 02/09/2012 ExitCare Patient Information 2015 [employer]. This information is not intended to replace advice given to you by your health care provider. Make sure you discuss any questions you have with your health care provider.    Smoking Cessation, Tips for Success If you are ready to quit smoking, congratulations! You have chosen to help yourself be healthier. Cigarettes bring nicotine, tar, carbon monoxide, and other irritants into your body. Your lungs, heart, and blood vessels will be able to work better without these poisons. There are many different ways to quit smoking. Nicotine gum, nicotine patches, a nicotine inhaler, or nicotine nasal spray can help with physical craving. Hypnosis, support groups, and medicines help break the habit of smoking. WHAT THINGS CAN I DO TO MAKE QUITTING EASIER?  Here are some tips to help you quit for good:  Pick a date when you will quit smoking completely. Tell all of your friends and family about your plan to quit on that date.  Do not try to slowly cut down on the number of cigarettes you are smoking. Pick a quit date and quit smoking completely starting on that  day.  Throw away all cigarettes.   Clean and remove all ashtrays from your home, work, and car.  On a card, write down your reasons for quitting. Carry the card with you and read it when you get the urge to smoke.  Cleanse your body of nicotine. Drink enough water and fluids to keep your urine clear or pale yellow. Do this after quitting to flush the nicotine from   your body.  Learn to predict your moods. Do not let a bad situation be your excuse to have a cigarette. Some situations in your life might tempt you into wanting a cigarette.  Never have "just one" cigarette. It leads to wanting another and another. Remind yourself of your decision to quit.  Change habits associated with smoking. If you smoked while driving or when feeling stressed, try other activities to replace smoking. Stand up when drinking your coffee. Brush your teeth after eating. Sit in a different chair when you read the paper. Avoid alcohol while trying to quit, and try to drink fewer caffeinated beverages. Alcohol and caffeine may urge you to smoke.  Avoid foods and drinks that can trigger a desire to smoke, such as sugary or spicy foods and alcohol.  Ask people who smoke not to smoke around you.  Have something planned to do right after eating or having a cup of coffee. For example, plan to take a walk or exercise.  Try a relaxation exercise to calm you down and decrease your stress. Remember, you may be tense and nervous for the first 2 weeks after you quit, but this will pass.  Find new activities to keep your hands busy. Play with a pen, coin, or rubber band. Doodle or draw things on paper.  Brush your teeth right after eating. This will help cut down on the craving for the taste of tobacco after meals. You can also try mouthwash.   Use oral substitutes in place of cigarettes. Try using lemon drops, carrots, cinnamon sticks, or chewing gum. Keep them handy so they are available when you have the urge to  smoke.  When you have the urge to smoke, try deep breathing.  Designate your home as a nonsmoking area.  If you are a heavy smoker, ask your health care provider about a prescription for nicotine chewing gum. It can ease your withdrawal from nicotine.  Reward yourself. Set aside the cigarette money you save and buy yourself something nice.  Look for support from others. Join a support group or smoking cessation program. Ask someone at home or at work to help you with your plan to quit smoking.  Always ask yourself, "Do I need this cigarette or is this just a reflex?" Tell yourself, "Today, I choose not to smoke," or "I do not want to smoke." You are reminding yourself of your decision to quit.  Do not replace cigarette smoking with electronic cigarettes (commonly called e-cigarettes). The safety of e-cigarettes is unknown, and some may contain harmful chemicals.  If you relapse, do not give up! Plan ahead and think about what you will do the next time you get the urge to smoke. HOW WILL I FEEL WHEN I QUIT SMOKING? You may have symptoms of withdrawal because your body is used to nicotine (the addictive substance in cigarettes). You may crave cigarettes, be irritable, feel very hungry, cough often, get headaches, or have difficulty concentrating. The withdrawal symptoms are only temporary. They are strongest when you first quit but will go away within 10-14 days. When withdrawal symptoms occur, stay in control. Think about your reasons for quitting. Remind yourself that these are signs that your body is healing and getting used to being without cigarettes. Remember that withdrawal symptoms are easier to treat than the major diseases that smoking can cause.  Even after the withdrawal is over, expect periodic urges to smoke. However, these cravings are generally short lived and will go away whether you   smoke or not. Do not smoke! WHAT RESOURCES ARE AVAILABLE TO HELP ME QUIT SMOKING? Your health care  provider can direct you to community resources or hospitals for support, which may include:  Group support.  Education.  Hypnosis.  Therapy. Document Released: 07/29/2004 Document Revised: 03/17/2014 Document Reviewed: 04/18/2013 ExitCare Patient Information 2015 [employer]. This information is not intended to replace advice given to you by your health care provider. Make sure you discuss any questions you have with your health care provider.  

## 2015-06-18 NOTE — Addendum Note (Signed)
Addended by: Adria Dill L on: 06/18/2015 01:52 PM   Modules accepted: Orders

## 2015-07-21 ENCOUNTER — Other Ambulatory Visit: Payer: Self-pay

## 2015-07-21 DIAGNOSIS — I739 Peripheral vascular disease, unspecified: Secondary | ICD-10-CM

## 2015-07-21 MED ORDER — CLOPIDOGREL BISULFATE 75 MG PO TABS: 75.0000 mg | ORAL_TABLET | Freq: Every day | ORAL | Status: DC

## 2016-06-17 ENCOUNTER — Ambulatory Visit: Payer: Managed Care, Other (non HMO) | Admitting: Family

## 2016-06-17 ENCOUNTER — Encounter (HOSPITAL_COMMUNITY): Payer: Managed Care, Other (non HMO)

## 2016-10-19 ENCOUNTER — Other Ambulatory Visit: Payer: Self-pay

## 2016-10-19 DIAGNOSIS — I739 Peripheral vascular disease, unspecified: Secondary | ICD-10-CM

## 2016-10-19 DIAGNOSIS — Z95828 Presence of other vascular implants and grafts: Secondary | ICD-10-CM

## 2016-10-19 MED ORDER — CLOPIDOGREL BISULFATE 75 MG PO TABS
75.0000 mg | ORAL_TABLET | Freq: Every day | ORAL | 4 refills | Status: AC
Start: 2016-10-19 — End: ?

## 2016-11-15 ENCOUNTER — Encounter: Payer: Self-pay | Admitting: Physician Assistant

## 2016-11-15 ENCOUNTER — Ambulatory Visit (INDEPENDENT_AMBULATORY_CARE_PROVIDER_SITE_OTHER): Payer: PRIVATE HEALTH INSURANCE | Admitting: Physician Assistant

## 2016-11-15 VITALS — BP 111/74 | HR 77 | Temp 98.3°F | Ht 66.0 in | Wt 154.2 lb

## 2016-11-15 DIAGNOSIS — Z23 Encounter for immunization: Secondary | ICD-10-CM | POA: Diagnosis not present

## 2016-11-15 DIAGNOSIS — Z1159 Encounter for screening for other viral diseases: Secondary | ICD-10-CM | POA: Diagnosis not present

## 2016-11-15 DIAGNOSIS — I70219 Atherosclerosis of native arteries of extremities with intermittent claudication, unspecified extremity: Secondary | ICD-10-CM

## 2016-11-15 DIAGNOSIS — Z0000 Encounter for general adult medical examination without abnormal findings: Secondary | ICD-10-CM | POA: Diagnosis not present

## 2016-11-15 DIAGNOSIS — F17200 Nicotine dependence, unspecified, uncomplicated: Secondary | ICD-10-CM | POA: Insufficient documentation

## 2016-11-15 DIAGNOSIS — Z114 Encounter for screening for human immunodeficiency virus [HIV]: Secondary | ICD-10-CM | POA: Diagnosis not present

## 2016-11-15 MED ORDER — VARENICLINE TARTRATE 1 MG PO TABS: 1.0000 mg | ORAL_TABLET | Freq: Two times a day (BID) | ORAL | 5 refills | Status: AC

## 2016-11-15 NOTE — Progress Notes (Signed)
Patient ID: Holly Barrera, female     DOB: 04/16/1961, 56 y.o.    MRN: [id number]  PCP: Porfirio Oar, PA-C, new as of today  Chief Complaint  Patient presents with  . Establish Care    Subjective:   This patient is new to me and presents to establish for primary care.  Her husband is my regular patient.  Last tetanus years ago. Last pap not recalled. Doesn't want one. Never had a mammogram. Heard that they hurt. Never had a colonoscopy.  Declines seasonal flu vaccine.  Is concerned that last night she was unable to feel the pulse in the RIGHT foot. She doesn't check routinely, "Whenever I think of it." Never followed up with vascular following   Review of Systems  Constitutional: Negative.   HENT: Negative for sore throat.   Eyes: Negative for visual disturbance.  Respiratory: Negative for cough, chest tightness, shortness of breath and wheezing.   Cardiovascular: Negative for chest pain, palpitations and leg swelling.  Gastrointestinal: Negative for abdominal pain, diarrhea, nausea and vomiting.  Genitourinary: Negative for dysuria, frequency, hematuria and urgency.  Musculoskeletal: Negative for arthralgias and myalgias.  Skin: Negative for rash.  Neurological: Negative for dizziness, weakness and headaches.  Psychiatric/Behavioral: Negative for decreased concentration. The patient is not nervous/anxious.      Prior to Admission medications   Medication Sig Start Date End Date Taking? Authorizing Provider  B Complex-C (SUPER B COMPLEX PO) Take 1 tablet by mouth daily.    Yes Historical Provider, MD  Calcium Carbonate-Vitamin D (CALCIUM 600 + D PO) Take 600 mg by mouth.   Yes Historical Provider, MD  Cholecalciferol (VITAMIN D3) 3000 UNITS TABS Take by mouth.   Yes Historical Provider, MD  clopidogrel (PLAVIX) 75 MG tablet Take 1 tablet (75 mg total) by mouth daily. 10/19/16  Yes Carma Lair Nickel, NP     Allergies  Allergen Reactions  . Penicillins  Hives     Patient Active Problem List   Diagnosis Date Noted  . Smoker 11/15/2016  . Atherosclerosis of native artery of extremity with intermittent claudication (HCC) 06/14/2013     Family History  Problem Relation Age of Onset  . Epilepsy Mother   . Lung cancer Father      Social History   Social History  . Marital status: Married    Spouse name: Fayrene Fearing  . Number of children: 0  . Years of education: 12th grade   Occupational History  . [employer]ITY GUARD    Social History Main Topics  . Smoking status: Current Every Day Smoker    Packs/day: 1.00    Years: 30.00    Start date: 10/03/1980  . Smokeless tobacco: Never Used     Comment: wants to quit: tried patches, tooth picks, "cold [place]," vaping, straws  . Alcohol use No  . Drug use: No  . Sexual activity: Yes   Other Topics Concern  . Not on file   Social History Narrative   Lives with her husband, Fayrene Fearing.         Objective:  Physical Exam  Constitutional: She is oriented to person, place, and time. She appears well-developed and well-nourished. She is active and cooperative. No distress.  BP 111/74 (BP Location: Right Arm, Patient Position: Sitting, Cuff Size: Small)   Pulse 77   Temp 98.3 F (36.8 C) (Oral)   Ht 5\' 6"  (1.676 m)   Wt 154 lb 3.2 oz (69.9 kg)   SpO2 98%   BMI 24.89  kg/m   HENT:  Head: Normocephalic and atraumatic.  Right Ear: Hearing normal.  Left Ear: Hearing normal.  Eyes: Conjunctivae are normal. No scleral icterus.  Neck: Normal range of motion. Neck supple. No thyromegaly present.  Cardiovascular: Normal rate, regular rhythm and normal heart sounds.   Pulses:      Radial pulses are 2+ on the right side, and 2+ on the left side.       Dorsalis pedis pulses are 2+ on the right side, and 2+ on the left side.       Posterior tibial pulses are 1+ on the right side, and 1+ on the left side.  Pulmonary/Chest: Effort normal and breath sounds normal.  Lymphadenopathy:       Head  (right side): No tonsillar, no preauricular, no posterior auricular and no occipital adenopathy present.       Head (left side): No tonsillar, no preauricular, no posterior auricular and no occipital adenopathy present.    She has no cervical adenopathy.       Right: No supraclavicular adenopathy present.       Left: No supraclavicular adenopathy present.  Neurological: She is alert and oriented to person, place, and time. No sensory deficit.  Skin: Skin is warm, dry and intact. No rash noted. No cyanosis or erythema. Nails show no clubbing.  Psychiatric: She has a normal mood and affect. Her speech is normal and behavior is normal.             Assessment & Plan:  1. Encounter for medical examination to establish care Reviewed health maintenance items that are outstanding. She declined most today, but agrees to consider them in the future.  2. Atherosclerosis of native artery of extremity with intermittent claudication, unspecified extremity (HCC) Encouraged smoking cessation. See #3. Continue clopidogrel. - CBC with Differential/Platelet - Comprehensive metabolic panel - Lipid panel  3. Smoker Smoking cessation. - varenicline (CHANTIX) 1 MG tablet; Take 1 tablet (1 mg total) by mouth 2 (two) times daily.  Dispense: 60 tablet; Refill: 5  4. Need for hepatitis C screening test - Hepatitis C antibody  5. Screening for HIV (human immunodeficiency virus) - HIV antibody  6. Need for Tdap vaccination - Tdap vaccine greater than or equal to 7yo IM   Return in about 6 months (around 05/15/2017).   Fernande Bras, PA-C Physician Assistant-Certified [facility]

## 2016-11-15 NOTE — Patient Instructions (Addendum)
Initial Chantix instructions: Set a quit date at least 7 days in the future. You should continue smoking for at least 7 days while taking the Chantix. Take 1/2 tablet once daily for 4 days, then 1/2 tablet twice daily for 4 days, then 1/2 tablet each morning and 1 whole tablet each evening for 4 days, then take 1 whole tablet twice daily.     IF you received an x-ray today, you will receive an invoice from [facility]. Please contact [facility] at [phone] with questions or concerns regarding your invoice.   IF you received labwork today, you will receive an invoice from [facility]. Please contact [facility] at [phone] with questions or concerns regarding your invoice.   Our billing staff will not be able to assist you with questions regarding bills from these companies.  You will be contacted with the lab results as soon as they are available. The fastest way to get your results is to activate your My Chart account. Instructions are located on the last page of this paperwork. If you have not heard from us regarding the results in 2 weeks, please contact this office.      

## 2016-11-16 LAB — COMPREHENSIVE METABOLIC PANEL
A/G RATIO: 1.7 (ref 1.2–2.2)
ALT: 12 IU/L (ref 0–32)
AST: 14 IU/L (ref 0–40)
Albumin: 4.4 g/dL (ref 3.5–5.5)
Alkaline Phosphatase: 84 IU/L (ref 39–117)
BUN / CREAT RATIO: 13 (ref 9–23)
BUN: 11 mg/dL (ref 6–24)
Bilirubin Total: 0.2 mg/dL (ref 0.0–1.2)
CALCIUM: 9.8 mg/dL (ref 8.7–10.2)
CO2: 21 mmol/L (ref 18–29)
Chloride: 105 mmol/L (ref 96–106)
Creatinine, Ser: 0.85 mg/dL (ref 0.57–1.00)
GFR, EST AFRICAN AMERICAN: 90 mL/min/{1.73_m2} (ref >59)
GFR, EST NON AFRICAN AMERICAN: 78 mL/min/{1.73_m2} (ref >59)
GLUCOSE: 78 mg/dL (ref 65–99)
Globulin, Total: 2.6 g/dL (ref 1.5–4.5)
Potassium: 4.6 mmol/L (ref 3.5–5.2)
Sodium: 142 mmol/L (ref 134–144)
TOTAL PROTEIN: 7 g/dL (ref 6.0–8.5)

## 2016-11-16 LAB — LIPID PANEL
CHOL/HDL RATIO: 5.4 ratio — AB (ref 0.0–4.4)
Cholesterol, Total: 270 mg/dL — ABNORMAL HIGH (ref 100–199)
HDL: 50 mg/dL (ref >39)
LDL Calculated: 190 mg/dL — ABNORMAL HIGH (ref 0–99)
Triglycerides: 149 mg/dL (ref 0–149)
VLDL CHOLESTEROL CAL: 30 mg/dL (ref 5–40)

## 2016-11-16 LAB — CBC WITH DIFFERENTIAL/PLATELET
BASOS: 1 %
Basophils Absolute: 0.1 10*3/uL (ref 0.0–0.2)
EOS (ABSOLUTE): 0.2 10*3/uL (ref 0.0–0.4)
Eos: 2 %
HEMOGLOBIN: 14.6 g/dL (ref 11.1–15.9)
Hematocrit: 43.4 % (ref 34.0–46.6)
IMMATURE GRANS (ABS): 0 10*3/uL (ref 0.0–0.1)
IMMATURE GRANULOCYTES: 0 %
LYMPHS: 31 %
Lymphocytes Absolute: 3.1 10*3/uL (ref 0.7–3.1)
MCH: 30.5 pg (ref 26.6–33.0)
MCHC: 33.6 g/dL (ref 31.5–35.7)
MCV: 91 fL (ref 79–97)
MONOCYTES: 7 %
Monocytes Absolute: 0.7 10*3/uL (ref 0.1–0.9)
NEUTROS ABS: 5.8 10*3/uL (ref 1.4–7.0)
NEUTROS PCT: 59 %
PLATELETS: 423 10*3/uL — AB (ref 150–379)
RBC: 4.79 x10E6/uL (ref 3.77–5.28)
RDW: 14.3 % (ref 12.3–15.4)
WBC: 9.9 10*3/uL (ref 3.4–10.8)

## 2016-11-16 LAB — HEPATITIS C ANTIBODY

## 2016-11-16 LAB — HIV ANTIBODY (ROUTINE TESTING W REFLEX): HIV SCREEN 4TH GENERATION: NONREACTIVE

## 2016-11-23 ENCOUNTER — Encounter: Payer: Self-pay | Admitting: Physician Assistant

## 2016-11-23 MED ORDER — ATORVASTATIN CALCIUM 20 MG PO TABS: 20.0000 mg | ORAL_TABLET | Freq: Every day | ORAL | 3 refills | Status: AC

## 2016-11-23 NOTE — Addendum Note (Signed)
Addended by: Fernande BrasJEFFERY, Taijah Macrae S on: 11/23/2016 11:08 AM   Modules accepted: Orders

## 2016-12-16 ENCOUNTER — Encounter: Payer: Self-pay | Admitting: Family

## 2016-12-23 ENCOUNTER — Inpatient Hospital Stay (HOSPITAL_COMMUNITY): Admission: RE | Admit: 2016-12-23 | Payer: Managed Care, Other (non HMO) | Source: Ambulatory Visit

## 2016-12-23 ENCOUNTER — Encounter (HOSPITAL_COMMUNITY): Payer: Managed Care, Other (non HMO)

## 2016-12-23 ENCOUNTER — Ambulatory Visit: Payer: Managed Care, Other (non HMO) | Admitting: Family

## 2017-05-16 ENCOUNTER — Ambulatory Visit: Payer: PRIVATE HEALTH INSURANCE | Admitting: Physician Assistant

## 2017-05-16 NOTE — Progress Notes (Deleted)
   Subjective:    Patient ID: Holly Barrera, female    DOB: 1961/05/20, 56 y.o.   MRN: [id number] PCP: Porfirio OarJeffery, Chelle, PA-C  HPI: 56 y/o F presents today for a 6 month follow up of smoking cessation with Chantix and for atherosclerosis.    Patient Active Problem List   Diagnosis Date Noted  . Smoker 11/15/2016  . Atherosclerosis of native artery of extremity with intermittent claudication (HCC) 06/14/2013   Past Medical History:  Diagnosis Date  . Arthritis   . Peripheral vascular disease (HCC)    Prior to Admission medications   Medication Sig Start Date End Date Taking? Authorizing Provider  atorvastatin (LIPITOR) 20 MG tablet Take 1 tablet (20 mg total) by mouth daily. 11/23/16   Jeffery, Chelle, PA-C  B Complex-C (SUPER B COMPLEX PO) Take 1 tablet by mouth daily.     [provider]  Calcium Carbonate-Vitamin D (CALCIUM 600 + D PO) Take 600 mg by mouth.    [provider]  Cholecalciferol (VITAMIN D3) 3000 UNITS TABS Take by mouth.    [provider]  clopidogrel (PLAVIX) 75 MG tablet Take 1 tablet (75 mg total) by mouth daily. 10/19/16   Nickel, Carma LairSuzanne L, NP  varenicline (CHANTIX) 1 MG tablet Take 1 tablet (1 mg total) by mouth 2 (two) times daily. 11/15/16   Porfirio OarJeffery, Chelle, PA-C   Allergies  Allergen Reactions  . Penicillins Hives    Review of Systems     Objective:   Physical Exam        Assessment & Plan:

## 2018-02-19 ENCOUNTER — Encounter: Payer: Self-pay | Admitting: Physician Assistant

## 2020-01-31 ENCOUNTER — Ambulatory Visit (INDEPENDENT_AMBULATORY_CARE_PROVIDER_SITE_OTHER): Payer: Self-pay

## 2020-01-31 ENCOUNTER — Other Ambulatory Visit: Payer: Self-pay

## 2020-01-31 ENCOUNTER — Ambulatory Visit: Payer: Self-pay

## 2020-01-31 ENCOUNTER — Ambulatory Visit (INDEPENDENT_AMBULATORY_CARE_PROVIDER_SITE_OTHER): Payer: PRIVATE HEALTH INSURANCE | Admitting: Podiatry

## 2020-01-31 DIAGNOSIS — M79671 Pain in right foot: Secondary | ICD-10-CM

## 2020-01-31 DIAGNOSIS — M2141 Flat foot [pes planus] (acquired), right foot: Secondary | ICD-10-CM

## 2020-01-31 DIAGNOSIS — M2142 Flat foot [pes planus] (acquired), left foot: Secondary | ICD-10-CM

## 2020-01-31 DIAGNOSIS — Q828 Other specified congenital malformations of skin: Secondary | ICD-10-CM

## 2020-01-31 DIAGNOSIS — M779 Enthesopathy, unspecified: Secondary | ICD-10-CM

## 2020-01-31 DIAGNOSIS — M79672 Pain in left foot: Secondary | ICD-10-CM

## 2020-02-02 ENCOUNTER — Encounter: Payer: Self-pay | Admitting: Podiatry

## 2020-02-02 NOTE — Progress Notes (Signed)
Subjective:  Patient ID: Holly Barrera, female    DOB: [birth year],  MRN: [id number]  Chief Complaint  Patient presents with  . Foot Pain    pt has bil foot pain, on both feet plantar forefoot and the bottom of both heels, pt states that it is painful to the touch. pt has tried self debridement but not much has helped.    59 y.o. female presents with the above complaint.  Patient presents with right submetatarsal 5 hyperkeratotic lesion that has been causing her a lot of pain.  She is constantly on her feet and is working on concrete floor which makes it tough.  It has been going for 2 to 3 weeks and has progressively gotten worse.  It hurts when walking on it.  Pain scale is 5 out of 10.  Patient has tried self debridement but has not helped much.  She also would like to talk more about orthotics and inserts given that she is very aggressive on her feet.  She has not tried anything custom-made.  She has tried over-the-counter orthotics which has not helped much.  She denies any other acute complaints.   Review of Systems: Negative except as noted in the HPI. Denies N/V/F/Ch.  Past Medical History:  Diagnosis Date  . Arthritis   . Peripheral vascular disease ([place])     Current Outpatient Medications:  .  Aspirin Buf,CaCarb-MgCarb-MgO, 81 MG TABS, Take by mouth., Disp: , Rfl:  .  atorvastatin (LIPITOR) 20 MG tablet, Take 1 tablet (20 mg total) by mouth daily., Disp: 90 tablet, Rfl: 3 .  B Complex-C (SUPER B COMPLEX PO), Take 1 tablet by mouth daily. , Disp: , Rfl:  .  Calcium Carbonate-Vitamin D (CALCIUM 600 + D PO), Take 600 mg by mouth., Disp: , Rfl:  .  Cholecalciferol (VITAMIN D3) 3000 UNITS TABS, Take by mouth., Disp: , Rfl:  .  varenicline (CHANTIX) 1 MG tablet, Take 1 tablet (1 mg total) by mouth 2 (two) times daily., Disp: 60 tablet, Rfl: 5 .  clopidogrel (PLAVIX) 75 MG tablet, Take 1 tablet (75 mg total) by mouth daily. (Patient not taking: Reported on 01/31/2020), Disp: 90  tablet, Rfl: 4  Social History   Tobacco Use  Smoking Status Current Every Day Smoker  . Packs/day: 1.00  . Years: 30.00  . Pack years: 30.00  . Start date: 10/03/1980  Smokeless Tobacco Never Used  Tobacco Comment   wants to quit: tried patches, tooth picks, "cold [place]," vaping, straws    Allergies  Allergen Reactions  . Statins Other (See Comments)    Muscle pain with low dose atorvastatin and rosuvastatin  . Penicillins Hives   Objective:  There were no vitals filed for this visit. There is no height or weight on file to calculate BMI. Constitutional Well developed. Well nourished.  Vascular Dorsalis pedis pulses palpable bilaterally. Posterior tibial pulses palpable bilaterally. Capillary refill normal to all digits.  No cyanosis or clubbing noted. Pedal hair growth normal.  Neurologic Normal speech. Oriented to person, place, and time. Epicritic sensation to light touch grossly present bilaterally.  Dermatologic Nails well groomed and normal in appearance. No open wounds. No skin lesions.  Orthopedic:  Right submetatarsal 5 porokeratosis with hyperkeratotic lesion nucleated core.  No pinpoint bleeding noted.  Pain on palpation.   Radiographs: 3 views of skeletally mature adult bilateral foot no bony abnormalities identified.  Foot is in good alignment and rectus position overall.  Previous correction noted on the left foot  with K wire fixation.  Good alignment and correction noted. Assessment:   1. Foot pain, bilateral   2. Porokeratosis   3. Pes planus of both feet    Plan:  Patient was evaluated and treated and all questions answered.  Right submetatarsal 5 porokeratosis/capsulitis -I explained to the patient the etiology of porokeratosis and various treatment options were discussed.  Given that there is a nucleated centralized core I believe patient will benefit from more aggressive debridement.  I also explained to her that there might be some inflammatory  component from chronic pressure to that area that might be giving her more acute pain.  I believe she will benefit from a steroid injection followed by an aggressive debridement of the lesion.  Patient states understanding would like to proceed with the debridement and injection -A steroid injection was performed at right submetatarsal five-point of maximal tenderness using 1% plain Lidocaine and 10 mg of Kenalog. This was well tolerated. -The lesion was aggressively debrided down to healthy striated tissue.  No pinpoint bleeding or complication noted.  semiflexible pes planus -I explained the patient the etiology of pes planus deformity and its relationship to porokeratosis/capsulitis to the right submetatarsal 5.  I believe patient will benefit from custom-made orthotics with offloading of the submetatarsal 5 on the right foot.  Patient states understanding would like to proceed with a custom-made orthotics   Return for See [facility] for orthotics ASAP.

## 2020-02-03 ENCOUNTER — Other Ambulatory Visit: Payer: Self-pay | Admitting: Podiatry

## 2020-02-03 DIAGNOSIS — M2141 Flat foot [pes planus] (acquired), right foot: Secondary | ICD-10-CM

## 2020-02-04 ENCOUNTER — Ambulatory Visit (INDEPENDENT_AMBULATORY_CARE_PROVIDER_SITE_OTHER): Payer: Self-pay | Admitting: Orthotics

## 2020-02-04 ENCOUNTER — Other Ambulatory Visit: Payer: Self-pay

## 2020-02-04 DIAGNOSIS — M2141 Flat foot [pes planus] (acquired), right foot: Secondary | ICD-10-CM

## 2020-02-04 DIAGNOSIS — M2142 Flat foot [pes planus] (acquired), left foot: Secondary | ICD-10-CM

## 2020-02-04 DIAGNOSIS — Q828 Other specified congenital malformations of skin: Secondary | ICD-10-CM

## 2020-02-04 NOTE — Progress Notes (Signed)
Patient was seen today for offloading painful plantar fibromas/keratomas.  Area of concerned was marked and patient was scanned/cast to offload the keratoma/fibroma.  A LW accomodative device will be fabricated for the patient with appropriate offloads.  

## 2020-02-25 ENCOUNTER — Other Ambulatory Visit: Payer: Self-pay

## 2020-02-25 ENCOUNTER — Ambulatory Visit: Payer: Self-pay | Admitting: Orthotics

## 2020-02-25 DIAGNOSIS — Q828 Other specified congenital malformations of skin: Secondary | ICD-10-CM

## 2020-02-25 DIAGNOSIS — M2141 Flat foot [pes planus] (acquired), right foot: Secondary | ICD-10-CM

## 2020-02-25 NOTE — Progress Notes (Signed)
Patient came in today to pick up custom made foot orthotics.  The goals were accomplished and the patient reported no dissatisfaction with said orthotics.  Patient was advised of breakin period and how to report any issues.
